# Patient Record
Sex: Female | Born: 1950 | Race: White | Hispanic: No | Marital: Married | Smoking: Never smoker
Health system: Southern US, Community
[De-identification: ages and names within clinical notes are randomized; demographics above are authoritative.]

## PROBLEM LIST (undated history)

## (undated) DIAGNOSIS — D259 Leiomyoma of uterus, unspecified: Secondary | ICD-10-CM

## (undated) DIAGNOSIS — Z860100 Personal history of colon polyps, unspecified: Secondary | ICD-10-CM

## (undated) DIAGNOSIS — Z780 Asymptomatic menopausal state: Secondary | ICD-10-CM

## (undated) DIAGNOSIS — B977 Papillomavirus as the cause of diseases classified elsewhere: Secondary | ICD-10-CM

## (undated) DIAGNOSIS — M810 Age-related osteoporosis without current pathological fracture: Secondary | ICD-10-CM

## (undated) DIAGNOSIS — E7800 Pure hypercholesterolemia, unspecified: Secondary | ICD-10-CM

## (undated) DIAGNOSIS — E559 Vitamin D deficiency, unspecified: Secondary | ICD-10-CM

## (undated) HISTORY — DX: Age-related osteoporosis without current pathological fracture: M81.0

## (undated) HISTORY — DX: Vitamin D deficiency, unspecified: E55.9

## (undated) HISTORY — DX: Leiomyoma of uterus, unspecified: D25.9

## (undated) HISTORY — DX: Asymptomatic menopausal state: Z78.0

## (undated) HISTORY — DX: Papillomavirus as the cause of diseases classified elsewhere: B97.7

## (undated) HISTORY — PX: DILATION AND CURETTAGE OF UTERUS: SHX78

---

## 1968-06-28 HISTORY — PX: OTHER SURGICAL HISTORY: SHX169

## 1998-10-20 ENCOUNTER — Other Ambulatory Visit: Admission: RE | Admit: 1998-10-20 | Discharge: 1998-10-20 | Payer: Self-pay | Admitting: Gynecology

## 2000-01-11 ENCOUNTER — Encounter: Payer: Self-pay | Admitting: Gynecology

## 2000-01-11 ENCOUNTER — Encounter: Admission: RE | Admit: 2000-01-11 | Discharge: 2000-01-11 | Payer: Self-pay | Admitting: Gynecology

## 2000-01-11 ENCOUNTER — Other Ambulatory Visit: Admission: RE | Admit: 2000-01-11 | Discharge: 2000-01-11 | Payer: Self-pay | Admitting: Gynecology

## 2001-01-24 ENCOUNTER — Encounter: Payer: Self-pay | Admitting: Gynecology

## 2001-01-24 ENCOUNTER — Encounter: Admission: RE | Admit: 2001-01-24 | Discharge: 2001-01-24 | Payer: Self-pay | Admitting: Gynecology

## 2001-01-24 ENCOUNTER — Other Ambulatory Visit: Admission: RE | Admit: 2001-01-24 | Discharge: 2001-01-24 | Payer: Self-pay | Admitting: Gynecology

## 2004-05-08 ENCOUNTER — Ambulatory Visit: Payer: Self-pay | Admitting: Obstetrics and Gynecology

## 2004-05-26 ENCOUNTER — Ambulatory Visit: Payer: Self-pay | Admitting: Obstetrics and Gynecology

## 2005-06-01 ENCOUNTER — Ambulatory Visit: Payer: Self-pay | Admitting: Obstetrics and Gynecology

## 2006-01-04 ENCOUNTER — Ambulatory Visit: Payer: Self-pay | Admitting: Endocrinology

## 2006-02-18 ENCOUNTER — Ambulatory Visit: Payer: Self-pay | Admitting: Psychiatry

## 2006-06-17 ENCOUNTER — Ambulatory Visit: Payer: Self-pay | Admitting: Obstetrics and Gynecology

## 2006-07-15 ENCOUNTER — Ambulatory Visit: Payer: Self-pay | Admitting: Gastroenterology

## 2007-07-17 ENCOUNTER — Ambulatory Visit: Payer: Self-pay | Admitting: Obstetrics and Gynecology

## 2008-07-18 ENCOUNTER — Ambulatory Visit: Payer: Self-pay | Admitting: Obstetrics and Gynecology

## 2009-07-18 ENCOUNTER — Ambulatory Visit: Payer: Self-pay | Admitting: Gastroenterology

## 2009-07-21 ENCOUNTER — Ambulatory Visit: Payer: Self-pay | Admitting: Obstetrics and Gynecology

## 2009-07-28 ENCOUNTER — Ambulatory Visit: Payer: Self-pay | Admitting: Gastroenterology

## 2009-12-23 DIAGNOSIS — C4492 Squamous cell carcinoma of skin, unspecified: Secondary | ICD-10-CM

## 2009-12-23 HISTORY — DX: Squamous cell carcinoma of skin, unspecified: C44.92

## 2010-01-09 ENCOUNTER — Ambulatory Visit: Payer: Self-pay | Admitting: Gastroenterology

## 2010-01-13 DIAGNOSIS — Z8589 Personal history of malignant neoplasm of other organs and systems: Secondary | ICD-10-CM

## 2010-01-13 HISTORY — DX: Personal history of malignant neoplasm of other organs and systems: Z85.89

## 2010-08-04 ENCOUNTER — Ambulatory Visit: Payer: Self-pay | Admitting: Obstetrics and Gynecology

## 2011-06-23 ENCOUNTER — Ambulatory Visit: Payer: Self-pay | Admitting: Obstetrics and Gynecology

## 2011-06-29 DIAGNOSIS — Z86018 Personal history of other benign neoplasm: Secondary | ICD-10-CM

## 2011-06-29 HISTORY — DX: Personal history of other benign neoplasm: Z86.018

## 2011-07-09 LAB — HM MAMMOGRAPHY: HM Mammogram: NORMAL

## 2011-08-25 ENCOUNTER — Ambulatory Visit: Payer: Self-pay | Admitting: Obstetrics and Gynecology

## 2012-01-09 LAB — LIPID PANEL
HDL: 107 mg/dL — AB (ref 35–70)
LDL Cholesterol: 115 mg/dL
Triglycerides: 59 mg/dL (ref 40–160)

## 2012-01-09 LAB — TSH: TSH: 3.4 u[IU]/mL (ref 0.41–5.90)

## 2012-01-09 LAB — HEPATIC FUNCTION PANEL
ALT: 22 U/L (ref 7–35)
AST: 27 U/L (ref 13–35)

## 2012-01-09 LAB — BASIC METABOLIC PANEL
Creatinine: 0.5 mg/dL (ref 0.5–1.1)
Glucose: 76 mg/dL

## 2012-06-07 LAB — HM PAP SMEAR: HM Pap smear: NORMAL

## 2012-07-26 ENCOUNTER — Ambulatory Visit: Payer: Self-pay | Admitting: Internal Medicine

## 2012-08-08 ENCOUNTER — Encounter: Payer: Self-pay | Admitting: Internal Medicine

## 2012-08-08 ENCOUNTER — Ambulatory Visit (INDEPENDENT_AMBULATORY_CARE_PROVIDER_SITE_OTHER): Payer: BC Managed Care – PPO | Admitting: Internal Medicine

## 2012-08-08 VITALS — BP 132/64 | HR 64 | Temp 97.8°F | Resp 16 | Ht 61.75 in | Wt 91.5 lb

## 2012-08-08 DIAGNOSIS — E785 Hyperlipidemia, unspecified: Secondary | ICD-10-CM

## 2012-08-08 DIAGNOSIS — M810 Age-related osteoporosis without current pathological fracture: Secondary | ICD-10-CM | POA: Insufficient documentation

## 2012-08-08 DIAGNOSIS — R5381 Other malaise: Secondary | ICD-10-CM

## 2012-08-08 DIAGNOSIS — Z1239 Encounter for other screening for malignant neoplasm of breast: Secondary | ICD-10-CM

## 2012-08-08 DIAGNOSIS — E559 Vitamin D deficiency, unspecified: Secondary | ICD-10-CM

## 2012-08-08 DIAGNOSIS — D259 Leiomyoma of uterus, unspecified: Secondary | ICD-10-CM

## 2012-08-08 NOTE — Progress Notes (Signed)
Patient ID: Kathleen Powers, female   DOB: 1951/02/26, 62 y.o.   MRN: [id number]   Patient Active Problem List  Diagnosis  . Osteoporosis, unspecified  . Uterine fibroid    Subjective:  CC:   Chief Complaint  Patient presents with  . Establish Care    HPI:   Kathleen Powers is a 62 y.o. female who presents as a new patient to establish primary care with the chief complaint of Osteoporosis . She was diagnosed over 4 years ago by PCP.  She is c. urrently on no medications by choice.  Takes calcium and vit  Participates in daily  weight bearing exercise including walking, Pilates and upper body exercuses with low weights.  Has a FH  Of vertebral fractures in mother  Which occurred during treatment for lung CA.  Patient has no history of fractures.  Prior trial of risedronate x 6  Months stopped after development of persistent r hip pain which resolved.  Wondering about Prolia bc it was brought up by former PCP .  Last DEXA 2013 by Moriar[facility] at his office.    2) Mild occasional constipation managed with fruit and water .     Past Medical History  Diagnosis Date  . Uterine fibroid     History reviewed. No pertinent past surgical history.  Family History  Problem Relation Age of Onset  . Cancer Mother     lung  . Heart disease Mother   . COPD Mother   . Heart disease Father     congestive heart failure, valvular cardiomyopathy  . Cancer Maternal Grandmother 13    ovarian  . Aneurysm Maternal Grandfather   . Heart attack Paternal Grandfather   . Heart attack Maternal Uncle     History   Social History  . Marital Status: Married    Spouse Name: N/A    Number of Children: N/A  . Years of Education: N/A   Occupational History  . Not on file.   Social History Main Topics  . Smoking status: Never Smoker   . Smokeless tobacco: Not on file  . Alcohol Use: Yes  . Drug Use: No  . Sexually Active: Not on file   Other Topics Concern  . Not on file   Social History  Narrative  . No narrative on file       @ALLHX @    Review of Systems:   The remainder of the review of systems was negative except those addressed in the HPI.       Objective:  BP 132/64  Pulse 64  Temp(Src) 97.8 F (36.6 C) (Oral)  Resp 16  Ht 5' 1.75" (1.568 m)  Wt 91 lb 8 oz (41.504 kg)  BMI 16.88 kg/m2  SpO2 98%  General appearance: alert, cooperative and appears stated age Neck: no adenopathy, no carotid bruit, supple, symmetrical, trachea midline and thyroid not enlarged, symmetric, no tenderness/mass/nodules Back: symmetric, no curvature. ROM normal. No CVA tenderness. Lungs: clear to auscultation bilaterally Heart: regular rate and rhythm, S1, S2 normal, no murmur, click, rub or gallop Abdomen: soft, non-tender; bowel sounds normal; no masses,  no organomegaly Pulses: 2+ and symmetric Skin: Skin color, texture, turgor normal. No rashes or lesions Lymph nodes: Cervical, supraclavicular, and axillary nodes normal. Neuro: grossly nonfocal  Assessment and Plan:  Osteoporosis, unspecified Untreated secondary to risedronate intolerance.  recoreds requested ,  Risk and benefits of Prolia and Evista discussed at length.  Will dell defer treatment until DEXAs can be reviewed.  Checking vit d level.   Uterine fibroid 4 x 3 x 3 cm by 2012 ultrasound.   Screening for breast cancer She is due for mammogram at [facility]    Updated Medication List Outpatient Encounter Prescriptions as of 08/08/2012  Medication Sig Dispense Refill  . Calcium Carbonate (CALCIUM 600 PO) Take 600 mg by mouth daily.      . Cholecalciferol (VITAMIN D) 1000 UNITS capsule Take 1,000 Units by mouth daily.      . milk thistle 175 MG tablet Take 175 mg by mouth daily.      . Multiple Vitamin (MULTIVITAMIN) tablet Take 1 tablet by mouth daily.      . Probiotic Product (PROBIOTIC DAILY PO) Take 1 capsule by mouth daily.      . TURMERIC PO Take 1 capsule by mouth daily.       No  facility-administered encounter medications on file as of 08/08/2012.

## 2012-08-09 ENCOUNTER — Encounter: Payer: Self-pay | Admitting: Internal Medicine

## 2012-08-09 DIAGNOSIS — Z1239 Encounter for other screening for malignant neoplasm of breast: Secondary | ICD-10-CM | POA: Insufficient documentation

## 2012-08-09 DIAGNOSIS — D259 Leiomyoma of uterus, unspecified: Secondary | ICD-10-CM | POA: Insufficient documentation

## 2012-08-09 NOTE — Assessment & Plan Note (Signed)
Untreated secondary to risedronate intolerance.  recoreds requested ,  Risk and benefits of Prolia and Evista discussed at length.  Will dell defer treatment until DEXAs can be reviewed.  Checking vit d level.

## 2012-08-09 NOTE — Assessment & Plan Note (Signed)
She is due for mammogram at [facility].

## 2012-08-09 NOTE — Assessment & Plan Note (Signed)
4 x 3 x 3 cm by 2012 ultrasound.

## 2012-08-29 ENCOUNTER — Ambulatory Visit: Payer: Self-pay | Admitting: Internal Medicine

## 2012-09-13 ENCOUNTER — Ambulatory Visit: Payer: Self-pay | Admitting: Internal Medicine

## 2012-10-03 ENCOUNTER — Encounter: Payer: Self-pay | Admitting: Internal Medicine

## 2013-02-06 ENCOUNTER — Telehealth: Payer: Self-pay | Admitting: Internal Medicine

## 2013-02-06 NOTE — Telephone Encounter (Signed)
Pt dropped of medical records for dr Darrick Huntsman to look and make copies for what dr Darrick Huntsman wants   And  Patient wants her records back  Please call pt when her records are ready for pick up In box

## 2013-02-07 NOTE — Telephone Encounter (Signed)
Patient talking about the large envelope I gave you at the end of the day mark what you would like copies of and give back and I will take care of it.

## 2013-02-08 ENCOUNTER — Telehealth: Payer: Self-pay | Admitting: Internal Medicine

## 2013-02-08 ENCOUNTER — Encounter: Payer: Self-pay | Admitting: Internal Medicine

## 2013-02-08 DIAGNOSIS — M810 Age-related osteoporosis without current pathological fracture: Secondary | ICD-10-CM

## 2013-02-08 NOTE — Assessment & Plan Note (Signed)
Last DEXA 2013 July Moryati  t Scores -2.97 spine  reclast or prolia advised

## 2013-02-08 NOTE — Telephone Encounter (Signed)
I have culled through her records and copied what I needed.  Thank her for supplying the,  She will want to pick them up

## 2013-02-12 NOTE — Telephone Encounter (Signed)
Notified patient records ready for pickup on 02/09/13

## 2013-03-01 ENCOUNTER — Ambulatory Visit (INDEPENDENT_AMBULATORY_CARE_PROVIDER_SITE_OTHER): Payer: BC Managed Care – PPO | Admitting: Internal Medicine

## 2013-03-01 ENCOUNTER — Other Ambulatory Visit (HOSPITAL_COMMUNITY)
Admission: RE | Admit: 2013-03-01 | Discharge: 2013-03-01 | Disposition: A | Discharge status: Home / Self Care | Payer: BC Managed Care – PPO | Source: Ambulatory Visit | Attending: Internal Medicine | Admitting: Internal Medicine

## 2013-03-01 ENCOUNTER — Telehealth: Payer: Self-pay | Admitting: *Deleted

## 2013-03-01 ENCOUNTER — Encounter: Payer: Self-pay | Admitting: Internal Medicine

## 2013-03-01 VITALS — BP 130/62 | HR 57 | Temp 98.1°F | Resp 12 | Ht 62.0 in | Wt 91.2 lb

## 2013-03-01 DIAGNOSIS — M810 Age-related osteoporosis without current pathological fracture: Secondary | ICD-10-CM

## 2013-03-01 DIAGNOSIS — R5381 Other malaise: Secondary | ICD-10-CM

## 2013-03-01 DIAGNOSIS — Z1151 Encounter for screening for human papillomavirus (HPV): Secondary | ICD-10-CM | POA: Insufficient documentation

## 2013-03-01 DIAGNOSIS — Z0000 Encounter for general adult medical examination without abnormal findings: Secondary | ICD-10-CM

## 2013-03-01 DIAGNOSIS — Z8742 Personal history of other diseases of the female genital tract: Secondary | ICD-10-CM

## 2013-03-01 DIAGNOSIS — E559 Vitamin D deficiency, unspecified: Secondary | ICD-10-CM

## 2013-03-01 DIAGNOSIS — E785 Hyperlipidemia, unspecified: Secondary | ICD-10-CM

## 2013-03-01 DIAGNOSIS — Z01419 Encounter for gynecological examination (general) (routine) without abnormal findings: Secondary | ICD-10-CM | POA: Insufficient documentation

## 2013-03-01 DIAGNOSIS — Z87898 Personal history of other specified conditions: Secondary | ICD-10-CM

## 2013-03-01 DIAGNOSIS — R87810 Cervical high risk human papillomavirus (HPV) DNA test positive: Secondary | ICD-10-CM | POA: Insufficient documentation

## 2013-03-01 DIAGNOSIS — N952 Postmenopausal atrophic vaginitis: Secondary | ICD-10-CM

## 2013-03-01 DIAGNOSIS — E673 Hypervitaminosis D: Secondary | ICD-10-CM

## 2013-03-01 LAB — CBC WITH DIFFERENTIAL/PLATELET
Basophils Absolute: 0 10*3/uL (ref 0.0–0.1)
Eosinophils Absolute: 0.2 10*3/uL (ref 0.0–0.7)
Hemoglobin: 12.5 g/dL (ref 12.0–15.0)
Lymphocytes Relative: 34.5 % (ref 12.0–46.0)
MCHC: 33.7 g/dL (ref 30.0–36.0)
Neutro Abs: 3.1 10*3/uL (ref 1.4–7.7)
Neutrophils Relative %: 54.4 % (ref 43.0–77.0)
Platelets: 230 10*3/uL (ref 150.0–400.0)
RDW: 13.4 % (ref 11.5–14.6)

## 2013-03-01 LAB — COMPREHENSIVE METABOLIC PANEL
ALT: 22 U/L (ref 0–35)
AST: 32 U/L (ref 0–37)
Albumin: 4.4 g/dL (ref 3.5–5.2)
CO2: 29 mEq/L (ref 19–32)
Calcium: 9.1 mg/dL (ref 8.4–10.5)
Chloride: 100 mEq/L (ref 96–112)
Potassium: 3.8 mEq/L (ref 3.5–5.1)
Total Protein: 6.6 g/dL (ref 6.0–8.3)

## 2013-03-01 LAB — LDL CHOLESTEROL, DIRECT: Direct LDL: 107.8 mg/dL

## 2013-03-01 LAB — LIPID PANEL: Total CHOL/HDL Ratio: 2

## 2013-03-01 MED ORDER — ESTRADIOL 10 MCG VA TABS: 1.0000 | ORAL_TABLET | VAGINAL | Status: DC

## 2013-03-01 NOTE — Patient Instructions (Addendum)
Mammogram to be set up i nMarch 2015  PAP smear done today; we'll contact you with the results ; Your labs as well

## 2013-03-01 NOTE — Telephone Encounter (Signed)
Pt would like results mailed to her and to call her with the results as well

## 2013-03-01 NOTE — Progress Notes (Addendum)
Patient ID: Kathleen Powers, female   DOB: 07-09-1950, 62 y.o.   MRN: [id number]  Subjective:     Kathleen Powers is a 62 y.o. female and is here for a comprehensive physical exam. The patient reports no problems.  History   Social History  . Marital Status: Married    Spouse Name: N/A    Number of Children: N/A  . Years of Education: N/A   Occupational History  . Not on file.   Social History Main Topics  . Smoking status: Never Smoker   . Smokeless tobacco: Not on file  . Alcohol Use: Yes  . Drug Use: No  . Sexual Activity: Not on file   Other Topics Concern  . Not on file   Social History Narrative  . No narrative on file   Health Maintenance  Topic Date Due  . Zostavax  01/08/2009  . Influenza Vaccine  01/26/2013  . Mammogram  08/30/2014  . Pap Smear  06/08/2015  . Tetanus/tdap  08/09/2019  . Colonoscopy  03/01/2020    The following portions of the patient's history were reviewed and updated as appropriate: allergies, current medications, past family history, past medical history, past social history, past surgical history and problem list.  Review of Systems A comprehensive review of systems was negative.   Objective:   General Appearance:    Alert, cooperative, no distress, appears stated age  Head:    Normocephalic, without obvious abnormality, atraumatic  Eyes:    PERRL, conjunctiva/corneas clear, EOM's intact, fundi    benign, both eyes  Ears:    Normal TM's and external ear canals, both ears  Nose:   Nares normal, septum midline, mucosa normal, no drainage    or sinus tenderness  Throat:   Lips, mucosa, and tongue normal; teeth and gums normal  Neck:   Supple, symmetrical, trachea midline, no adenopathy;    thyroid:  no enlargement/tenderness/nodules; no carotid   bruit or JVD  Back:     Symmetric, no curvature, ROM normal, no CVA tenderness  Lungs:     Clear to auscultation bilaterally, respirations unlabored  Chest Wall:    No tenderness or  deformity   Heart:    Regular rate and rhythm, S1 and S2 normal, no murmur, rub   or gallop  Breast Exam:    No tenderness, masses, or nipple abnormality  Abdomen:     Soft, non-tender, bowel sounds active all four quadrants,    no masses, no organomegaly  Genitalia:    Pelvic: cervix scarred ,  external genitalia normal, no adnexal masses or tenderness, no cervical motion tenderness, rectovaginal septum normal, uterus normal size, shape, and consistency and vagina normal without discharge  Extremities:   Extremities normal, atraumatic, no cyanosis or edema  Pulses:   2+ and symmetric all extremities  Skin:   Skin color, texture, turgor normal, no rashes or lesions  Lymph nodes:   Cervical, supraclavicular, and axillary nodes normal  Neurologic:   CNII-XII intact, normal strength, sensation and reflexes    throughout   Assessment:   Encounter for preventive health examination Annual comprehensive exam was done including breast, pelvic and PAP smear. All screenings have been addressed .   Osteoporosis, unspecified She is not due for DEXA until next year.  Bone Density scores received, Continue calcium, vitamin d and weight bearing exercise on a regular basis.   Hypervitaminosis d  Patient was asked to stop all vitamin D supplementation other than what is included her calcium tablet  and return in 3 months for a repeat vitamin D level.   Updated Medication List Outpatient Encounter Prescriptions as of 03/01/2013  Medication Sig Dispense Refill  . Cholecalciferol (VITAMIN D) 1000 UNITS capsule Take 1,000 Units by mouth daily.      . milk thistle 175 MG tablet Take 175 mg by mouth daily.      . Multiple Vitamin (MULTIVITAMIN) tablet Take 1 tablet by mouth daily.      . Probiotic Product (PROBIOTIC DAILY PO) Take 1 capsule by mouth daily.      . TURMERIC PO Take 1 capsule by mouth daily.      . Calcium Carbonate (CALCIUM 600 PO) Take 600 mg by mouth daily.      . Estradiol 10 MCG TABS  vaginal tablet Place 1 tablet (10 mcg total) vaginally 2 (two) times a week.  8 tablet  11   No facility-administered encounter medications on file as of 03/01/2013.

## 2013-03-02 LAB — VITAMIN D 25 HYDROXY (VIT D DEFICIENCY, FRACTURES): Vit D, 25-Hydroxy: 92 ng/mL — ABNORMAL HIGH (ref 30–89)

## 2013-03-03 DIAGNOSIS — Z0000 Encounter for general adult medical examination without abnormal findings: Secondary | ICD-10-CM | POA: Insufficient documentation

## 2013-03-03 NOTE — Assessment & Plan Note (Signed)
Annual comprehensive exam was done including breast, pelvic and PAP smear. All screenings have been addressed .  

## 2013-03-03 NOTE — Assessment & Plan Note (Signed)
She is not due for DEXA until next year.  Bone Density scores received, Continue calcium, vitamin d and weight bearing exercise on a regular basis.

## 2013-03-04 ENCOUNTER — Encounter: Payer: Self-pay | Admitting: Internal Medicine

## 2013-03-04 DIAGNOSIS — E673 Hypervitaminosis D: Secondary | ICD-10-CM | POA: Insufficient documentation

## 2013-03-04 NOTE — Assessment & Plan Note (Signed)
Patient was asked to stop all vitamin D supplementation other than what is included her calcium tablet and return in 3 months for a repeat vitamin D level.

## 2013-03-04 NOTE — Addendum Note (Signed)
Addended by: Sherlene Shams on: 03/04/2013 08:40 AM   Modules accepted: Orders

## 2013-03-08 ENCOUNTER — Encounter: Payer: Self-pay | Admitting: *Deleted

## 2013-03-20 NOTE — Telephone Encounter (Signed)
Results mailed 

## 2013-05-03 ENCOUNTER — Other Ambulatory Visit: Payer: Self-pay

## 2013-08-15 ENCOUNTER — Telehealth: Payer: Self-pay | Admitting: Internal Medicine

## 2013-08-15 DIAGNOSIS — N952 Postmenopausal atrophic vaginitis: Secondary | ICD-10-CM

## 2013-08-15 MED ORDER — ESTRADIOL 10 MCG VA TABS: 1.0000 | ORAL_TABLET | VAGINAL | Status: DC

## 2013-08-15 NOTE — Telephone Encounter (Signed)
Received refill request for Estriol 1 mg. Vaginal tabs. Insert 1 tablet vaginally twice weekly. This was not her medication list, please advise.

## 2013-08-15 NOTE — Telephone Encounter (Signed)
Ok to refill,  Authorized in epic 

## 2013-08-16 ENCOUNTER — Other Ambulatory Visit: Payer: Self-pay | Admitting: *Deleted

## 2013-08-16 DIAGNOSIS — N952 Postmenopausal atrophic vaginitis: Secondary | ICD-10-CM

## 2013-08-16 NOTE — Telephone Encounter (Signed)
Script faxed to pharmacy

## 2013-08-17 ENCOUNTER — Other Ambulatory Visit: Payer: Self-pay | Admitting: *Deleted

## 2013-08-17 DIAGNOSIS — N952 Postmenopausal atrophic vaginitis: Secondary | ICD-10-CM

## 2013-08-24 ENCOUNTER — Telehealth: Payer: Self-pay | Admitting: *Deleted

## 2013-08-24 NOTE — Telephone Encounter (Signed)
Verbal given 

## 2013-08-24 NOTE — Telephone Encounter (Signed)
Patient is requesting a compound made by [facility] and not the Vagifem is it ok to fill? Please advise.

## 2013-08-24 NOTE — Telephone Encounter (Signed)
Yes, have [facility] send me the rx and I will sign

## 2013-09-04 ENCOUNTER — Other Ambulatory Visit: Payer: Self-pay | Admitting: Internal Medicine

## 2013-09-04 ENCOUNTER — Encounter: Payer: Self-pay | Admitting: Internal Medicine

## 2013-09-04 ENCOUNTER — Ambulatory Visit (INDEPENDENT_AMBULATORY_CARE_PROVIDER_SITE_OTHER): Payer: BC Managed Care – PPO | Admitting: Internal Medicine

## 2013-09-04 VITALS — BP 140/62 | HR 61 | Temp 98.2°F | Resp 16 | Wt 94.8 lb

## 2013-09-04 DIAGNOSIS — E673 Hypervitaminosis D: Secondary | ICD-10-CM

## 2013-09-04 DIAGNOSIS — IMO0002 Reserved for concepts with insufficient information to code with codable children: Secondary | ICD-10-CM

## 2013-09-04 DIAGNOSIS — E559 Vitamin D deficiency, unspecified: Secondary | ICD-10-CM

## 2013-09-04 DIAGNOSIS — Z124 Encounter for screening for malignant neoplasm of cervix: Secondary | ICD-10-CM

## 2013-09-04 NOTE — Patient Instructions (Signed)
We repeated your PAP smear today  We will refer  You to Dr Enzo Bi if Kathleen Powers is still HPV positive  Vitamin D level to be rechecked today

## 2013-09-05 ENCOUNTER — Ambulatory Visit: Payer: BC Managed Care – PPO | Admitting: Internal Medicine

## 2013-09-05 ENCOUNTER — Encounter: Payer: Self-pay | Admitting: Internal Medicine

## 2013-09-05 DIAGNOSIS — R87810 Cervical high risk human papillomavirus (HPV) DNA test positive: Secondary | ICD-10-CM | POA: Insufficient documentation

## 2013-09-05 LAB — VITAMIN D 25 HYDROXY (VIT D DEFICIENCY, FRACTURES): Vit D, 25-Hydroxy: 70 ng/mL (ref 30–89)

## 2013-09-05 NOTE — Progress Notes (Signed)
Patient ID: Kathleen Powers, female   DOB: 09/17/1950, 63 y.o.   MRN: [id number]   Patient Active Problem List   Diagnosis Date Noted  . Pap smear for cervical cancer screening 09/05/2013  . Hypervitaminosis d 03/04/2013  . Encounter for preventive health examination 03/03/2013  . Screening for breast cancer 08/09/2012  . Uterine fibroid   . Osteoporosis, unspecified 08/08/2012    Subjective:  CC:   Chief Complaint  Patient presents with  . Follow-up    with repeat PAP    HPI:   Kathleen Powers is a 63 y.o. female who presents for repeat PAP smear as her last one was HPV positive 6 months ago.  She has not been sexually active and has no history of  Abnormal PAP smears.    Past Medical History  Diagnosis Date  . Uterine fibroid     No past surgical history on file.     The following portions of the patient's history were reviewed and updated as appropriate: Allergies, current medications, and problem list.    Review of Systems:   Patient denies headache, fevers, malaise, unintentional weight loss, skin rash, eye pain, sinus congestion and sinus pain, sore throat, dysphagia,  hemoptysis , cough, dyspnea, wheezing, chest pain, palpitations, orthopnea, edema, abdominal pain, nausea, melena, diarrhea, constipation, flank pain, dysuria, hematuria, urinary  Frequency, nocturia, numbness, tingling, seizures,  Focal weakness, Loss of consciousness,  Tremor, insomnia, depression, anxiety, and suicidal ideation.     History   Social History  . Marital Status: Married    Spouse Name: N/A    Number of Children: N/A  . Years of Education: N/A   Occupational History  . Not on file.   Social History Main Topics  . Smoking status: Never Smoker   . Smokeless tobacco: Not on file  . Alcohol Use: Yes  . Drug Use: No  . Sexual Activity: Not on file   Other Topics Concern  . Not on file   Social History Narrative  . No narrative on file    Objective:  Filed  Vitals:   09/04/13 1551  BP: 140/62  Pulse: 61  Temp: 98.2 F (36.8 C)  Resp: 16   General Appearance:    Alert, cooperative, no distress, appears stated age  Head:    Normocephalic, without obvious abnormality, atraumatic  Eyes:    PERRL, conjunctiva/corneas clear, EOM's intact, fundi    benign, both eyes  Ears:    Normal TM's and external ear canals, both ears  Nose:   Nares normal, septum midline, mucosa normal, no drainage    or sinus tenderness  Throat:   Lips, mucosa, and tongue normal; teeth and gums normal  Neck:   Supple, symmetrical, trachea midline, no adenopathy;    thyroid:  no enlargement/tenderness/nodules; no carotid   bruit or JVD  Back:     Symmetric, no curvature, ROM normal, no CVA tenderness  Lungs:     Clear to auscultation bilaterally, respirations unlabored  Chest Wall:    No tenderness or deformity   Heart:    Regular rate and rhythm, S1 and S2 normal, no murmur, rub   or gallop  Breast Exam:    No tenderness, masses, or nipple abnormality  Abdomen:     Soft, non-tender, bowel sounds active all four quadrants,    no masses, no organomegaly  Genitalia:    Pelvic: cervix normal in appearance, external genitalia normal, no adnexal masses or tenderness, no cervical motion tenderness, rectovaginal septum normal,  uterus normal size, shape, and consistency and vagina normal without discharge  Extremities:   Extremities normal, atraumatic, no cyanosis or edema  Pulses:   2+ and symmetric all extremities  Skin:   Skin color, texture, turgor normal, no rashes or lesions  Lymph nodes:   Cervical, supraclavicular, and axillary nodes normal  Neurologic:   CNII-XII intact, normal strength, sensation and reflexes    throughout     Assessment and Plan:  Pap smear for cervical cancer screening PAP smear repeated today .  If HPV remains positive, will refer to Dr Enzo Bi for evaluation.  Hypervitaminosis d Repeat level afgter stopping supplements is normal at  70   Updated Medication List Outpatient Encounter Prescriptions as of 09/04/2013  Medication Sig  . COD LIVER OIL PO Take 2.5 mLs by mouth daily.  . milk thistle 175 MG tablet Take 175 mg by mouth daily.  . Multiple Vitamin (MULTIVITAMIN) tablet Take 1 tablet by mouth daily.  . Probiotic Product (PROBIOTIC DAILY PO) Take 1 capsule by mouth daily.  . Calcium Carbonate (CALCIUM 600 PO) Take 600 mg by mouth daily.  . Cholecalciferol (VITAMIN D) 1000 UNITS capsule Take 1,000 Units by mouth daily.  . Estradiol 10 MCG TABS vaginal tablet Place 1 tablet (10 mcg total) vaginally 2 (two) times a week.  . TURMERIC PO Take 1 capsule by mouth daily.     Orders Placed This Encounter  Procedures  . Vit D  25 hydroxy (rtn osteoporosis monitoring)    No Follow-up on file.

## 2013-09-05 NOTE — Assessment & Plan Note (Signed)
PAP smear repeated today .  If HPV remains positive, will refer to Dr Enzo Bi for evaluation.

## 2013-09-05 NOTE — Assessment & Plan Note (Signed)
Repeat level afgter stopping supplements is normal at 70

## 2013-09-06 ENCOUNTER — Ambulatory Visit: Payer: Self-pay | Admitting: Internal Medicine

## 2013-09-10 LAB — PAP IG AND HPV HIGH-RISK: HPV DNA High Risk: DETECTED — AB

## 2013-09-11 ENCOUNTER — Encounter: Payer: Self-pay | Admitting: Emergency Medicine

## 2013-09-11 ENCOUNTER — Encounter: Payer: Self-pay | Admitting: Internal Medicine

## 2013-09-11 NOTE — Addendum Note (Signed)
Addended by: Crecencio Mc on: 09/11/2013 07:27 AM   Modules accepted: Orders

## 2013-09-14 ENCOUNTER — Telehealth: Payer: Self-pay | Admitting: Internal Medicine

## 2013-09-14 DIAGNOSIS — Z0279 Encounter for issue of other medical certificate: Secondary | ICD-10-CM

## 2013-09-14 NOTE — Telephone Encounter (Signed)
In red folder. 

## 2013-09-14 NOTE — Telephone Encounter (Signed)
Pt dropped off health examination certificate In box Please call when ready for pick up

## 2013-09-21 NOTE — Telephone Encounter (Signed)
Notified patient left detailed message on voicemail paperwork placed up front for pickup.

## 2013-10-02 ENCOUNTER — Encounter: Payer: Self-pay | Admitting: Internal Medicine

## 2013-10-30 ENCOUNTER — Other Ambulatory Visit: Payer: Self-pay | Admitting: *Deleted

## 2013-10-30 DIAGNOSIS — N952 Postmenopausal atrophic vaginitis: Secondary | ICD-10-CM

## 2013-11-05 ENCOUNTER — Other Ambulatory Visit: Payer: Self-pay | Admitting: *Deleted

## 2013-11-05 DIAGNOSIS — N952 Postmenopausal atrophic vaginitis: Secondary | ICD-10-CM

## 2013-11-05 MED ORDER — ESTRADIOL 10 MCG VA TABS: 1.0000 | ORAL_TABLET | VAGINAL | Status: DC

## 2013-11-06 NOTE — Telephone Encounter (Signed)
Called [facility] and found that patient estrogen is a compound called Estriol E3 that is made for patient called and filled medicationas requested, and ask in future to fax request as we cannot put a compound in [facility].

## 2014-03-12 ENCOUNTER — Encounter: Payer: Self-pay | Admitting: Internal Medicine

## 2014-03-12 ENCOUNTER — Telehealth: Payer: Self-pay | Admitting: Internal Medicine

## 2014-03-12 ENCOUNTER — Ambulatory Visit (INDEPENDENT_AMBULATORY_CARE_PROVIDER_SITE_OTHER): Payer: PRIVATE HEALTH INSURANCE | Admitting: Internal Medicine

## 2014-03-12 ENCOUNTER — Encounter: Payer: BC Managed Care – PPO | Admitting: Internal Medicine

## 2014-03-12 VITALS — BP 138/78 | HR 69 | Temp 97.7°F | Resp 14 | Ht 61.5 in | Wt 88.0 lb

## 2014-03-12 DIAGNOSIS — M810 Age-related osteoporosis without current pathological fracture: Secondary | ICD-10-CM

## 2014-03-12 DIAGNOSIS — E785 Hyperlipidemia, unspecified: Secondary | ICD-10-CM

## 2014-03-12 DIAGNOSIS — E673 Hypervitaminosis D: Secondary | ICD-10-CM

## 2014-03-12 DIAGNOSIS — Z0000 Encounter for general adult medical examination without abnormal findings: Secondary | ICD-10-CM

## 2014-03-12 DIAGNOSIS — Z1239 Encounter for other screening for malignant neoplasm of breast: Secondary | ICD-10-CM

## 2014-03-12 DIAGNOSIS — R634 Abnormal weight loss: Secondary | ICD-10-CM

## 2014-03-12 DIAGNOSIS — Z1159 Encounter for screening for other viral diseases: Secondary | ICD-10-CM

## 2014-03-12 DIAGNOSIS — R87810 Cervical high risk human papillomavirus (HPV) DNA test positive: Secondary | ICD-10-CM

## 2014-03-12 NOTE — Telephone Encounter (Signed)
Dr. Buckner Malta PAP smear was HPV positive but he did not get the cervix sampled as he implied at your visit.  Follow up with him as previously advised

## 2014-03-12 NOTE — Progress Notes (Signed)
Pre-visit discussion using our clinic review tool. No additional management support is needed unless otherwise documented below in the visit note.  

## 2014-03-12 NOTE — Patient Instructions (Addendum)
You had your annual  wellness exam today.  We will get the records from Dr Enzo Bi  We will schedule your mammogram at  In March at [place] .   You declined the influenza,  penumonia and Shingles vaccines today.    Please make an appt for fasting labs at your earliest convenience.   We will contact you with the bloodwork results   I recommend getting the majority of your calcium and Vitamin D  through diet rather than supplements given the recent association of calcium supplements with increased coronary artery calcium scores  Unsweetened almond/coconut milk is a great low calorie low carb way to increase your dietary calcium and vitamin D.  Try the blue [facility] Maintenance Adopting a healthy lifestyle and getting preventive care can go a long way to promote health and wellness. Talk with your health care provider about what schedule of regular examinations is right for you. This is a good chance for you to check in with your provider about disease prevention and staying healthy. In between checkups, there are plenty of things you can do on your own. Experts have done a lot of research about which lifestyle changes and preventive measures are most likely to keep you healthy. Ask your health care provider for more information. WEIGHT AND DIET  Eat a healthy diet  Be sure to include plenty of vegetables, fruits, low-fat dairy products, and lean protein.  Do not eat a lot of foods high in solid fats, added sugars, or salt.  Get regular exercise. This is one of the most important things you can do for your health.  Most adults should exercise for at least 150 minutes each week. The exercise should increase your heart rate and make you sweat (moderate-intensity exercise).  Most adults should also do strengthening exercises at least twice a week. This is in addition to the moderate-intensity exercise.  Maintain a healthy weight  Body mass index (BMI) is a measurement  that can be used to identify possible weight problems. It estimates body fat based on height and weight. Your health care provider can help determine your BMI and help you achieve or maintain a healthy weight.  For females 72 years of age and older:   A BMI below 18.5 is considered underweight.  A BMI of 18.5 to 24.9 is normal.  A BMI of 25 to 29.9 is considered overweight.  A BMI of 30 and above is considered obese.  Watch levels of cholesterol and blood lipids  You should start having your blood tested for lipids and cholesterol at 65 years of age, then have this test every 5 years.  You may need to have your cholesterol levels checked more often if:  Your lipid or cholesterol levels are high.  You are older than 65 years of age.  You are at high risk for heart disease.  CANCER SCREENING   Lung Cancer  Lung cancer screening is recommended for adults 32-89 years old who are at high risk for lung cancer because of a history of smoking.  A yearly low-dose CT scan of the lungs is recommended for people who:  Currently smoke.  Have quit within the past 15 years.  Have at least a 30-pack-year history of smoking. A pack year is smoking an average of one pack of cigarettes a day for 1 year.  Yearly screening should continue until it has been 15 years since you quit.  Yearly screening should stop if you develop  a health problem that would prevent you from having lung cancer treatment.  Breast Cancer  Practice breast self-awareness. This means understanding how your breasts normally appear and feel.  It also means doing regular breast self-exams. Let your health care provider know about any changes, no matter how small.  If you are in your 20s or 30s, you should have a clinical breast exam (CBE) by a health care provider every 1-3 years as part of a regular health exam.  If you are 32 or older, have a CBE every year. Also consider having a breast X-ray (mammogram) every  year.  If you have a family history of breast cancer, talk to your health care provider about genetic screening.  If you are at high risk for breast cancer, talk to your health care provider about having an MRI and a mammogram every year.  Breast cancer gene (BRCA) assessment is recommended for women who have family members with BRCA-related cancers. BRCA-related cancers include:  Breast.  Ovarian.  Tubal.  Peritoneal cancers.  Results of the assessment will determine the need for genetic counseling and BRCA1 and BRCA2 testing. Cervical Cancer Routine pelvic examinations to screen for cervical cancer are no longer recommended for nonpregnant women who are considered low risk for cancer of the pelvic organs (ovaries, uterus, and vagina) and who do not have symptoms. A pelvic examination may be necessary if you have symptoms including those associated with pelvic infections. Ask your health care provider if a screening pelvic exam is right for you.   The Pap test is the screening test for cervical cancer for women who are considered at risk.  If you had a hysterectomy for a problem that was not cancer or a condition that could lead to cancer, then you no longer need Pap tests.  If you are older than 65 years, and you have had normal Pap tests for the past 10 years, you no longer need to have Pap tests.  If you have had past treatment for cervical cancer or a condition that could lead to cancer, you need Pap tests and screening for cancer for at least 20 years after your treatment.  If you no longer get a Pap test, assess your risk factors if they change (such as having a new sexual partner). This can affect whether you should start being screened again.  Some women have medical problems that increase their chance of getting cervical cancer. If this is the case for you, your health care provider may recommend more frequent screening and Pap tests.  The human papillomavirus (HPV) test is  another test that may be used for cervical cancer screening. The HPV test looks for the virus that can cause cell changes in the cervix. The cells collected during the Pap test can be tested for HPV.  The HPV test can be used to screen women 58 years of age and older. Getting tested for HPV can extend the interval between normal Pap tests from three to five years.  An HPV test also should be used to screen women of any age who have unclear Pap test results.  After 65 years of age, women should have HPV testing as often as Pap tests.  Colorectal Cancer  This type of cancer can be detected and often prevented.  Routine colorectal cancer screening usually begins at 64 years of age and continues through 65 years of age.  Your health care provider may recommend screening at an earlier age if you have risk  factors for colon cancer.  Your health care provider may also recommend using home test kits to check for hidden blood in the stool.  A small camera at the end of a tube can be used to examine your colon directly (sigmoidoscopy or colonoscopy). This is done to check for the earliest forms of colorectal cancer.  Routine screening usually begins at age 83.  Direct examination of the colon should be repeated every 5-10 years through 65 years of age. However, you may need to be screened more often if early forms of precancerous polyps or small growths are found. Skin Cancer  Check your skin from head to toe regularly.  Tell your health care provider about any new moles or changes in moles, especially if there is a change in a mole's shape or color.  Also tell your health care provider if you have a mole that is larger than the size of a pencil eraser.  Always use sunscreen. Apply sunscreen liberally and repeatedly throughout the day.  Protect yourself by wearing long sleeves, pants, a wide-brimmed hat, and sunglasses whenever you are outside. HEART DISEASE, DIABETES, AND HIGH BLOOD PRESSURE    Have your blood pressure checked at least every 1-2 years. High blood pressure causes heart disease and increases the risk of stroke.  If you are between 31 years and 31 years old, ask your health care provider if you should take aspirin to prevent strokes.  Have regular diabetes screenings. This involves taking a blood sample to check your fasting blood sugar level.  If you are at a normal weight and have a low risk for diabetes, have this test once every three years after 65 years of age.  If you are overweight and have a high risk for diabetes, consider being tested at a younger age or more often. PREVENTING INFECTION  Hepatitis B  If you have a higher risk for hepatitis B, you should be screened for this virus. You are considered at high risk for hepatitis B if:  You were born in a country where hepatitis B is common. Ask your health care provider which countries are considered high risk.  Your parents were born in a high-risk country, and you have not been immunized against hepatitis B (hepatitis B vaccine).  You have HIV or AIDS.  You use needles to inject street drugs.  You live with someone who has hepatitis B.  You have had sex with someone who has hepatitis B.  You get hemodialysis treatment.  You take certain medicines for conditions, including cancer, organ transplantation, and autoimmune conditions. Hepatitis C  Blood testing is recommended for:  Everyone born from [birth year] through 1967.  Anyone with known risk factors for hepatitis C. Sexually transmitted infections (STIs)  You should be screened for sexually transmitted infections (STIs) including gonorrhea and chlamydia if:  You are sexually active and are younger than 65 years of age.  You are older than 65 years of age and your health care provider tells you that you are at risk for this type of infection.  Your sexual activity has changed since you were last screened and you are at an increased risk for  chlamydia or gonorrhea. Ask your health care provider if you are at risk.  If you do not have HIV, but are at risk, it may be recommended that you take a prescription medicine daily to prevent HIV infection. This is called pre-exposure prophylaxis (PrEP). You are considered at risk if:  You are sexually active  and do not regularly use condoms or know the HIV status of your partner(s).  You take drugs by injection.  You are sexually active with a partner who has HIV. Talk with your health care provider about whether you are at high risk of being infected with HIV. If you choose to begin PrEP, you should first be tested for HIV. You should then be tested every 3 months for as long as you are taking PrEP.  PREGNANCY   If you are premenopausal and you may become pregnant, ask your health care provider about preconception counseling.  If you may become pregnant, take 400 to 800 micrograms (mcg) of folic acid every day.  If you want to prevent pregnancy, talk to your health care provider about birth control (contraception). OSTEOPOROSIS AND MENOPAUSE   Osteoporosis is a disease in which the bones lose minerals and strength with aging. This can result in serious bone fractures. Your risk for osteoporosis can be identified using a bone density scan.  If you are 50 years of age or older, or if you are at risk for osteoporosis and fractures, ask your health care provider if you should be screened.  Ask your health care provider whether you should take a calcium or vitamin D supplement to lower your risk for osteoporosis.  Menopause may have certain physical symptoms and risks.  Hormone replacement therapy may reduce some of these symptoms and risks. Talk to your health care provider about whether hormone replacement therapy is right for you.  HOME CARE INSTRUCTIONS   Schedule regular health, dental, and eye exams.  Stay current with your immunizations.   Do not use any tobacco products  including cigarettes, chewing tobacco, or electronic cigarettes.  If you are pregnant, do not drink alcohol.  If you are breastfeeding, limit how much and how often you drink alcohol.  Limit alcohol intake to no more than 1 drink per day for nonpregnant women. One drink equals 12 ounces of beer, 5 ounces of wine, or 1 ounces of hard liquor.  Do not use street drugs.  Do not share needles.  Ask your health care provider for help if you need support or information about quitting drugs.  Tell your health care provider if you often feel depressed.  Tell your health care provider if you have ever been abused or do not feel safe at home. Document Released: 12/28/2010 Document Revised: 10/29/2013 Document Reviewed: 05/16/2013 [facility] Patient Information 2015 [place], [place]. This information is not intended to replace advice given to you by your health care provider. Make sure you discuss any questions you have with your health care provider.

## 2014-03-13 NOTE — Telephone Encounter (Signed)
Sent mychart message

## 2014-03-14 NOTE — Progress Notes (Signed)
Patient ID: Kathleen Powers, female   DOB: 29-Mar-1951, 63 y.o.   MRN: [id number]   The patient is here for [facility] to Medicare examination and management of other chronic and acute problems.   The risk factors are reflected in the social history.  The roster of all physicians providing medical care to patient - is listed in the Snapshot section of the chart.  Activities of daily living:  The patient is 100% independent in all ADLs: dressing, toileting, feeding as well as independent mobility  Home safety : The patient has smoke detectors in the home. They wear seatbelts.  There are no firearms at home. There is no violence in the home.   There is no risks for hepatitis, STDs or HIV. There is no   history of blood transfusion. They have no travel history to infectious disease endemic areas of the world.  The patient has seen their dentist in the last six month. They have seen their eye doctor in the last year. They admit to slight hearing difficulty with regard to whispered voices and some television programs.  They have deferred audiologic testing in the last year.  They do not  have excessive sun exposure. Discussed the need for sun protection: hats, long sleeves and use of sunscreen if there is significant sun exposure.   Diet: the importance of a healthy diet is discussed. They do have a healthy diet.  The benefits of regular aerobic exercise were discussed. She walks 4 times per week ,  20 minutes.   Depression screen: there are no signs or vegative symptoms of depression- irritability, change in appetite, anhedonia, sadness/tearfullness.  Cognitive assessment: the patient manages all their financial and personal affairs and is actively engaged. They could relate day,date,year and events; recalled 2/3 objects at 3 minutes; performed clock-face test normally.  The following portions of the patient's history were reviewed and updated as appropriate: allergies, current medications, past  family history, past medical history,  past surgical history, past social history  and problem list.  Visual acuity was not assessed per patient preference since she has regular follow up with her ophthalmologist. Hearing and body mass index were assessed and reviewed.   During the course of the visit the patient was educated and counseled about appropriate screening and preventive services including : fall prevention , diabetes screening, nutrition counseling, colorectal cancer screening, and recommended immunizations.    Objective:  BP 138/78  Pulse 69  Temp(Src) 97.7 F (36.5 C) (Oral)  Resp 14  Ht 5' 1.5" (1.562 m)  Wt 88 lb (39.917 kg)  BMI 16.36 kg/m2  SpO2 99%  General appearance: alert, cooperative and appears stated age Head: Normocephalic, without obvious abnormality, atraumatic Eyes: conjunctivae/corneas clear. PERRL, EOM's intact. Fundi benign. Ears: normal TM's and external ear canals both ears Nose: Nares normal. Septum midline. Mucosa normal. No drainage or sinus tenderness. Throat: lips, mucosa, and tongue normal; teeth and gums normal Neck: no adenopathy, no carotid bruit, no JVD, supple, symmetrical, trachea midline and thyroid not enlarged, symmetric, no tenderness/mass/nodules Lungs: clear to auscultation bilaterally Breasts: normal appearance, no masses or tenderness Heart: regular rate and rhythm, S1, S2 normal, no murmur, click, rub or gallop Abdomen: soft, non-tender; bowel sounds normal; no masses,  no organomegaly Extremities: extremities normal, atraumatic, no cyanosis or edema Pulses: 2+ and symmetric Skin: Skin color, texture, turgor normal. No rashes or lesions Neurologic: Alert and oriented X 3, normal strength and tone. Normal symmetric reflexes. Normal coordination and gait.  Assessment and Plan:  Encounter for preventive health examination Welcome to  Medicare comprehensive physical exam was done. .  During the course of the visit the patient  was educated and counseled about appropriate screening and preventive services including : fall prevention , diabetes screening, nutrition counseling, colorectal cancer screening, and recommended immunizations.  Printed recommendations for health maintenance screenings was given.   Hypervitaminosis d Repeat level to be checked.  Patient is no longer taking supplements.  Discussed the current controversies surrounding the risks and benefits of calcium supplementation.  Encouraged her to increase dietary calcium through natural foods including almond/coconut milk  Cervical high risk human papillomavirus (HPV) DNA test positive In March 2015,  With referral to Dr Enzo Bi done but results not available. Patient is scheduled to see him again next month after a colposcopy was done   Osteoporosis, unspecified Repeat DEXA is due this ye at two yr interval with plans for Prolia discussed.  Discussed the current controversies surrounding the risks and benefits of calcium supplementation.  Encouraged her to increase dietary calcium through natural foods including almond/coconut milk   Updated Medication List Outpatient Encounter Prescriptions as of 03/12/2014  Medication Sig  . COD LIVER OIL PO Take 2.5 mLs by mouth daily.  . Estradiol 10 MCG TABS vaginal tablet Place 1 tablet (10 mcg total) vaginally 2 (two) times a week.  . milk thistle 175 MG tablet Take 175 mg by mouth daily.  . Multiple Vitamin (MULTIVITAMIN) tablet Take 1 tablet by mouth daily.  . Probiotic Product (PROBIOTIC DAILY PO) Take 1 capsule by mouth daily.  . Calcium Carbonate (CALCIUM 600 PO) Take 600 mg by mouth daily.  . Cholecalciferol (VITAMIN D) 1000 UNITS capsule Take 1,000 Units by mouth daily.  . TURMERIC PO Take 1 capsule by mouth daily.

## 2014-03-14 NOTE — Assessment & Plan Note (Addendum)
In March 2015,  With referral to Dr Enzo Bi done but results not available. Patient is scheduled to see him again next month after a colposcopy was done

## 2014-03-14 NOTE — Assessment & Plan Note (Signed)
Repeat level to be checked.  Patient is no longer taking supplements.  Discussed the current controversies surrounding the risks and benefits of calcium supplementation.  Encouraged her to increase dietary calcium through natural foods including almond/coconut milk

## 2014-03-14 NOTE — Assessment & Plan Note (Signed)
Welcome to  Medicare comprehensive physical exam was done. .  During the course of the visit the patient was educated and counseled about appropriate screening and preventive services including : fall prevention , diabetes screening, nutrition counseling, colorectal cancer screening, and recommended immunizations.  Printed recommendations for health maintenance screenings was given.

## 2014-03-14 NOTE — Assessment & Plan Note (Signed)
Repeat DEXA is due this ye at two yr interval with plans for Prolia discussed.  Discussed the current controversies surrounding the risks and benefits of calcium supplementation.  Encouraged her to increase dietary calcium through natural foods including almond/coconut milk

## 2014-03-15 NOTE — Telephone Encounter (Signed)
Mailed unread message to pt  

## 2014-03-19 ENCOUNTER — Telehealth: Payer: Self-pay | Admitting: *Deleted

## 2014-03-19 ENCOUNTER — Other Ambulatory Visit (INDEPENDENT_AMBULATORY_CARE_PROVIDER_SITE_OTHER): Payer: PRIVATE HEALTH INSURANCE

## 2014-03-19 DIAGNOSIS — D7589 Other specified diseases of blood and blood-forming organs: Secondary | ICD-10-CM

## 2014-03-19 DIAGNOSIS — R748 Abnormal levels of other serum enzymes: Secondary | ICD-10-CM

## 2014-03-19 DIAGNOSIS — Z1159 Encounter for screening for other viral diseases: Secondary | ICD-10-CM

## 2014-03-19 DIAGNOSIS — R634 Abnormal weight loss: Secondary | ICD-10-CM

## 2014-03-19 DIAGNOSIS — E785 Hyperlipidemia, unspecified: Secondary | ICD-10-CM

## 2014-03-19 LAB — CBC WITH DIFFERENTIAL/PLATELET
BASOS ABS: 0 10*3/uL (ref 0.0–0.1)
Basophils Relative: 0.6 % (ref 0.0–3.0)
EOS PCT: 5.9 % — AB (ref 0.0–5.0)
Eosinophils Absolute: 0.2 10*3/uL (ref 0.0–0.7)
HCT: 37.3 % (ref 36.0–46.0)
Hemoglobin: 12.6 g/dL (ref 12.0–15.0)
Lymphocytes Relative: 48 % — ABNORMAL HIGH (ref 12.0–46.0)
Lymphs Abs: 2 10*3/uL (ref 0.7–4.0)
MCHC: 33.7 g/dL (ref 30.0–36.0)
MCV: 102.6 fl — ABNORMAL HIGH (ref 78.0–100.0)
MONOS PCT: 8.1 % (ref 3.0–12.0)
Monocytes Absolute: 0.3 10*3/uL (ref 0.1–1.0)
Neutro Abs: 1.5 10*3/uL (ref 1.4–7.7)
Neutrophils Relative %: 37.4 % — ABNORMAL LOW (ref 43.0–77.0)
PLATELETS: 264 10*3/uL (ref 150.0–400.0)
RBC: 3.63 Mil/uL — ABNORMAL LOW (ref 3.87–5.11)
RDW: 13.6 % (ref 11.5–15.5)
WBC: 4.1 10*3/uL (ref 4.0–10.5)

## 2014-03-19 LAB — COMPREHENSIVE METABOLIC PANEL
ALBUMIN: 4.4 g/dL (ref 3.5–5.2)
ALT: 30 U/L (ref 0–35)
AST: 41 U/L — ABNORMAL HIGH (ref 0–37)
Alkaline Phosphatase: 49 U/L (ref 39–117)
BUN: 18 mg/dL (ref 6–23)
CO2: 27 mEq/L (ref 19–32)
Calcium: 8.9 mg/dL (ref 8.4–10.5)
Chloride: 105 mEq/L (ref 96–112)
Creatinine, Ser: 0.4 mg/dL (ref 0.4–1.2)
GFR: 160.84 mL/min (ref >60.00)
Glucose, Bld: 80 mg/dL (ref 70–99)
Potassium: 3.8 mEq/L (ref 3.5–5.1)
SODIUM: 139 meq/L (ref 135–145)
TOTAL PROTEIN: 7.2 g/dL (ref 6.0–8.3)
Total Bilirubin: 0.5 mg/dL (ref 0.2–1.2)

## 2014-03-19 LAB — LIPID PANEL
CHOLESTEROL: 238 mg/dL — AB (ref 0–200)
HDL: 103.8 mg/dL (ref >39.00)
LDL CALC: 122 mg/dL — AB (ref 0–99)
NONHDL: 134.2
Total CHOL/HDL Ratio: 2
Triglycerides: 59 mg/dL (ref 0.0–149.0)
VLDL: 11.8 mg/dL (ref 0.0–40.0)

## 2014-03-19 LAB — TSH: TSH: 3.02 u[IU]/mL (ref 0.35–4.50)

## 2014-03-19 NOTE — Telephone Encounter (Signed)
Pt would like a vit. D?

## 2014-03-19 NOTE — Telephone Encounter (Signed)
No thanks  

## 2014-03-20 LAB — HEPATITIS C ANTIBODY: HCV AB: NEGATIVE

## 2014-03-23 ENCOUNTER — Encounter: Payer: Self-pay | Admitting: Internal Medicine

## 2014-03-23 DIAGNOSIS — D7589 Other specified diseases of blood and blood-forming organs: Secondary | ICD-10-CM | POA: Insufficient documentation

## 2014-03-23 NOTE — Addendum Note (Signed)
Addended by: Crecencio Mc on: 03/23/2014 09:28 PM   Modules accepted: Orders

## 2014-03-25 ENCOUNTER — Other Ambulatory Visit (INDEPENDENT_AMBULATORY_CARE_PROVIDER_SITE_OTHER): Payer: PRIVATE HEALTH INSURANCE

## 2014-03-25 ENCOUNTER — Other Ambulatory Visit: Payer: Self-pay | Admitting: *Deleted

## 2014-03-25 ENCOUNTER — Telehealth: Payer: Self-pay

## 2014-03-25 DIAGNOSIS — R748 Abnormal levels of other serum enzymes: Secondary | ICD-10-CM

## 2014-03-25 DIAGNOSIS — E673 Hypervitaminosis D: Secondary | ICD-10-CM

## 2014-03-25 DIAGNOSIS — D7589 Other specified diseases of blood and blood-forming organs: Secondary | ICD-10-CM

## 2014-03-25 LAB — HEPATIC FUNCTION PANEL
ALT: 29 U/L (ref 0–35)
AST: 35 U/L (ref 0–37)
Albumin: 4.8 g/dL (ref 3.5–5.2)
Alkaline Phosphatase: 51 U/L (ref 39–117)
BILIRUBIN DIRECT: 0.1 mg/dL (ref 0.0–0.3)
TOTAL PROTEIN: 7.4 g/dL (ref 6.0–8.3)
Total Bilirubin: 0.7 mg/dL (ref 0.2–1.2)

## 2014-03-25 NOTE — Telephone Encounter (Signed)
The patient is coming in this afternoon for lab work. She is hoping an order can be added to check her Vitamin D level

## 2014-03-25 NOTE — Telephone Encounter (Signed)
It has been added.

## 2014-03-25 NOTE — Telephone Encounter (Signed)
ok 

## 2014-03-26 ENCOUNTER — Encounter: Payer: Self-pay | Admitting: Internal Medicine

## 2014-03-26 LAB — VITAMIN D 25 HYDROXY (VIT D DEFICIENCY, FRACTURES): VITD: 60.11 ng/mL (ref 30.00–100.00)

## 2014-03-26 LAB — FOLATE RBC: RBC Folate: 837 ng/mL (ref >280)

## 2014-03-26 LAB — VITAMIN B12: Vitamin B-12: 520 pg/mL (ref 211–911)

## 2014-03-27 ENCOUNTER — Encounter: Payer: Self-pay | Admitting: Internal Medicine

## 2014-09-06 DIAGNOSIS — Z86010 Personal history of colonic polyps: Secondary | ICD-10-CM | POA: Insufficient documentation

## 2014-09-17 ENCOUNTER — Ambulatory Visit: Payer: Self-pay | Admitting: Internal Medicine

## 2014-09-17 LAB — HM MAMMOGRAPHY: HM Mammogram: NEGATIVE

## 2014-09-30 ENCOUNTER — Ambulatory Visit
Admit: 2014-09-30 | Disposition: A | Discharge status: Home / Self Care | Payer: Self-pay | Attending: Gastroenterology | Admitting: Gastroenterology

## 2014-09-30 LAB — HM COLONOSCOPY

## 2014-10-04 ENCOUNTER — Encounter: Payer: Self-pay | Admitting: *Deleted

## 2014-10-16 LAB — HM PAP SMEAR: HM Pap smear: NEGATIVE

## 2014-10-21 ENCOUNTER — Encounter: Payer: Self-pay | Admitting: Internal Medicine

## 2014-10-21 LAB — SURGICAL PATHOLOGY

## 2015-04-17 ENCOUNTER — Encounter: Payer: Self-pay | Admitting: Obstetrics and Gynecology

## 2015-04-17 ENCOUNTER — Ambulatory Visit (INDEPENDENT_AMBULATORY_CARE_PROVIDER_SITE_OTHER): Payer: Medicare Other | Admitting: Obstetrics and Gynecology

## 2015-04-17 VITALS — BP 160/72 | HR 64 | Ht 62.0 in | Wt 91.0 lb

## 2015-04-17 DIAGNOSIS — R87610 Atypical squamous cells of undetermined significance on cytologic smear of cervix (ASC-US): Secondary | ICD-10-CM

## 2015-04-17 DIAGNOSIS — R87810 Cervical high risk human papillomavirus (HPV) DNA test positive: Secondary | ICD-10-CM

## 2015-04-17 NOTE — Progress Notes (Signed)
Patient ID: Kathleen Powers, female   DOB: 01/26/1951, 64 y.o.   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  Patient presents for 6 month interval Pap smear.  Previous Pap smear on 10/16/2014 was negative/negative. Patient denies any vaginal bleeding or discharge.  Past medical history, past surgical history, past problem list, medications, and allergies are reviewed and updated.  Review of systems: Per HPI  OBJECTIVE: BP 160/72 mmHg  Pulse 64  Ht 5\' 2"  (1.575 m)  Wt 91 lb (41.277 kg)  BMI 16.64 kg/m2 Pleasant, thin, elderly female in no acute distress.  Pelvic exam: External genitalia-normal external genitalia with the exception of mild atrophy. BUS: Negative. Vagina: Atrophy present. Cervix: Stenotic os; no lesions Uterus: Not examined.  IMPRESSION: 1.  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 2.  Vaginal atrophy. 3.  No cervical lesions.  PLAN: 1.  Pap/HPV. 2.  If testing is negative/negative, will have patient proceed with yearly Pap smears.  Brayton Mars, MD

## 2015-04-17 NOTE — Patient Instructions (Signed)
1.  Pap smear is completed today. 2.  Patient will have compounding prescription from [place] be sent to [place] for authorization for her vaginal estriol medication 3.  Return in 6 months for annual gynecologic physical

## 2015-04-20 LAB — PAP IG AND HPV HIGH-RISK: PAP Smear Comment: 0

## 2015-05-12 ENCOUNTER — Other Ambulatory Visit: Payer: Self-pay

## 2015-05-12 DIAGNOSIS — N952 Postmenopausal atrophic vaginitis: Secondary | ICD-10-CM

## 2015-05-12 MED ORDER — ESTRADIOL 10 MCG VA TABS: 1.0000 | ORAL_TABLET | VAGINAL | Status: DC

## 2015-10-16 ENCOUNTER — Ambulatory Visit (INDEPENDENT_AMBULATORY_CARE_PROVIDER_SITE_OTHER): Payer: Medicare Other | Admitting: Obstetrics and Gynecology

## 2015-10-16 ENCOUNTER — Encounter: Payer: Self-pay | Admitting: Obstetrics and Gynecology

## 2015-10-16 VITALS — BP 157/74 | HR 68 | Ht 62.0 in | Wt 91.2 lb

## 2015-10-16 DIAGNOSIS — Z1239 Encounter for other screening for malignant neoplasm of breast: Secondary | ICD-10-CM

## 2015-10-16 DIAGNOSIS — N952 Postmenopausal atrophic vaginitis: Secondary | ICD-10-CM

## 2015-10-16 DIAGNOSIS — Z780 Asymptomatic menopausal state: Secondary | ICD-10-CM | POA: Diagnosis not present

## 2015-10-16 DIAGNOSIS — Z124 Encounter for screening for malignant neoplasm of cervix: Secondary | ICD-10-CM | POA: Diagnosis not present

## 2015-10-16 MED ORDER — ESTRADIOL 10 MCG VA TABS: 1.0000 | ORAL_TABLET | VAGINAL | Status: DC

## 2015-10-16 NOTE — Progress Notes (Signed)
Patient ID: Kathleen Powers, female   DOB: Oct 09, 1950, 65 y.o.   MRN: [id number] ANNUAL PREVENTATIVE CARE GYN  ENCOUNTER NOTE  Subjective:       Kathleen Powers is a 65 y.o. G0P0000 female here for a routine annual gynecologic exam.  Current complaints: 1.  none   Patient is using compounded vaginal estrogen therapy for atrophic vaginitis Gynecologic History No LMP recorded. Patient is postmenopausal. Contraception: post menopausal status Last Pap: 03/2015 neg/neg. Results were: normal Last mammogram: 2016. Results were: normal-negative/negative  Obstetric History OB History  Gravida Para Term Preterm AB SAB TAB Ectopic Multiple Living  0 0 0 0 0 0 0 0 0 0         Past Medical History  Diagnosis Date  . Uterine fibroid   . HPV (human papilloma virus) infection   . Vitamin D deficiency   . Uterine fibroid   . Osteoporosis   . Postmenopausal     Past Surgical History  Procedure Laterality Date  . Cryoablation  1970    Current Outpatient Prescriptions on File Prior to Visit  Medication Sig Dispense Refill  . Estradiol 10 MCG TABS vaginal tablet Place 1 tablet (10 mcg total) vaginally 2 (two) times a week. 8 tablet 5  . milk thistle 175 MG tablet Take 175 mg by mouth daily.    . Multiple Vitamin (MULTI-VITAMINS) TABS Take by mouth.     No current facility-administered medications on file prior to visit.    Allergies  Allergen Reactions  . Codeine Nausea And Vomiting    Social History   Social History  . Marital Status: Married    Spouse Name: N/A  . Number of Children: N/A  . Years of Education: N/A   Occupational History  . Not on file.   Social History Main Topics  . Smoking status: Former Research scientist (life sciences)  . Smokeless tobacco: Not on file  . Alcohol Use: Yes  . Drug Use: No  . Sexual Activity: Not Currently    Birth Control/ Protection: Post-menopausal   Other Topics Concern  . Not on file   Social History Narrative    Family History  Problem  Relation Age of Onset  . Cancer Mother     lung  . Heart disease Mother   . COPD Mother   . Osteoporosis Mother   . Heart disease Father     congestive heart failure, valvular cardiomyopathy  . Cancer Maternal Grandmother 87    ovarian  . Aneurysm Maternal Grandfather   . Heart attack Paternal Grandfather   . Heart attack Maternal Uncle   . Diabetes Neg Hx     The following portions of the patient's history were reviewed and updated as appropriate: allergies, current medications, past family history, past medical history, past social history, past surgical history and problem list.  Review of Systems ROS Review of Systems - General ROS: negative for - chills, fatigue, fever, hot flashes, night sweats, weight gain or weight loss Psychological ROS: negative for - anxiety, decreased libido, depression, mood swings, physical abuse or sexual abuse Ophthalmic ROS: negative for - blurry vision, eye pain or loss of vision ENT ROS: negative for - headaches, hearing change, visual changes or vocal changes Allergy and Immunology ROS: negative for - hives, itchy/watery eyes or seasonal allergies Hematological and Lymphatic ROS: negative for - bleeding problems, bruising, swollen lymph nodes or weight loss Endocrine ROS: negative for - galactorrhea, hair pattern changes, hot flashes, malaise/lethargy, mood swings, palpitations, polydipsia/polyuria, skin changes,  temperature intolerance or unexpected weight changes Breast ROS: negative for - new or changing breast lumps or nipple discharge Respiratory ROS: negative for - cough or shortness of breath Cardiovascular ROS: negative for - chest pain, irregular heartbeat, palpitations or shortness of breath Gastrointestinal ROS: no abdominal pain, change in bowel habits, or black or bloody stools Genito-Urinary ROS: no dysuria, trouble voiding, or hematuria Musculoskeletal ROS: negative for - joint pain or joint stiffness Neurological ROS: negative for  - bowel and bladder control changes Dermatological ROS: negative for rash and skin lesion changes   Objective:   BP 157/74 mmHg  Pulse 68  Ht 5\' 2"  (1.575 m)  Wt 91 lb 3.2 oz (41.368 kg)  BMI 16.68 kg/m2 CONSTITUTIONAL: Well-developed, well-nourished female in no acute distress.  PSYCHIATRIC: Normal mood and affect. Normal behavior. Normal judgment and thought content. [place]: Alert and oriented to person, place, and time. Normal muscle tone coordination. No cranial nerve deficit noted. HENT:  Normocephalic, atraumatic, External right and left ear normal. Oropharynx is clear and moist EYES: Conjunctivae and EOM are normal. Pupils are equal, round, and reactive to light. No scleral icterus.  NECK: Normal range of motion, supple, no masses.  Normal thyroid.  SKIN: Skin is warm and dry. No rash noted. Not diaphoretic. No erythema. No pallor. CARDIOVASCULAR: Normal heart rate noted, regular rhythm, no murmur. RESPIRATORY: Clear to auscultation bilaterally. Effort and breath sounds normal, no problems with respiration noted. BREASTS: Symmetric in size. No masses, skin changes, nipple drainage, or lymphadenopathy. ABDOMEN: Soft, normal bowel sounds, no distention noted.  No tenderness, rebound or guarding.  BLADDER: Normal PELVIC:  External Genitalia: Normal  BUS: Normal  Vagina: Mild atrophy  Cervix: Normal; no lesions  Uterus: Normal; small midplane mobile and nontender  Adnexa: Normal  RV: External Exam NormaI, No Rectal Masses and Normal Sphincter tone  MUSCULOSKELETAL: Normal range of motion. No tenderness.  No cyanosis, clubbing, or edema.  2+ distal pulses. LYMPHATIC: No Axillary, Supraclavicular, or Inguinal Adenopathy.    Assessment:   Annual gynecologic examination 65 y.o. Contraception: post menopausal status bmi- 16 Menopausal state, asymptomatic Vaginal atrophy, mild, treated with vaginal estrogen compounded tablets History of ASCUS/HPV Pap smear; last Pap smear in  October 2016 negative/negative Osteoporosis   Plan:  Pap: Deferred till 2018 Mammogram: Ordered Stool Guaiac Testing:  Pt declines. Status post colonoscopy 09/30/2014 Labs: thru pcp Routine preventative health maintenance measures emphasized: Exercise/Diet/Weight control, Tobacco Warnings, Alcohol/Substance use risks and Safe Sex Vaginal estrogen pills (compounded) refilled Return to [place], CMA  Brayton Mars, MD  Note: This dictation was prepared with Dragon dictation along with smaller phrase technology. Any transcriptional errors that result from this process are unintentional.

## 2015-10-16 NOTE — Patient Instructions (Signed)
1. Pap smear not done this year. 2. Mammogram ordered 3. Stool guaiac cards testing was not done this year because of  colonoscopy in April 2016 4. Recommend calcium 1200 mg a day. Source of calcium is calcium citrate (Citracal) or Tums or Viactiv chews 5. Continue with healthy eating and exercise 6. Return in 1 year 7. Compound vaginal estrogen prescription is refilled

## 2015-10-20 ENCOUNTER — Other Ambulatory Visit: Payer: Self-pay

## 2015-11-06 ENCOUNTER — Ambulatory Visit
Admission: RE | Admit: 2015-11-06 | Discharge: 2015-11-06 | Disposition: A | Discharge status: Home / Self Care | Payer: Medicare Other | Source: Ambulatory Visit | Attending: Obstetrics and Gynecology | Admitting: Obstetrics and Gynecology

## 2015-11-06 ENCOUNTER — Other Ambulatory Visit: Payer: Self-pay | Admitting: Obstetrics and Gynecology

## 2015-11-06 DIAGNOSIS — Z1239 Encounter for other screening for malignant neoplasm of breast: Secondary | ICD-10-CM

## 2015-11-06 DIAGNOSIS — Z1231 Encounter for screening mammogram for malignant neoplasm of breast: Secondary | ICD-10-CM | POA: Diagnosis present

## 2016-10-11 ENCOUNTER — Other Ambulatory Visit: Payer: Self-pay | Admitting: Obstetrics and Gynecology

## 2016-10-11 DIAGNOSIS — Z1231 Encounter for screening mammogram for malignant neoplasm of breast: Secondary | ICD-10-CM

## 2016-10-21 ENCOUNTER — Encounter: Payer: Medicare Other | Admitting: Obstetrics and Gynecology

## 2016-10-22 ENCOUNTER — Ambulatory Visit (INDEPENDENT_AMBULATORY_CARE_PROVIDER_SITE_OTHER): Payer: Medicare Other | Admitting: Obstetrics and Gynecology

## 2016-10-22 ENCOUNTER — Encounter: Payer: Self-pay | Admitting: Obstetrics and Gynecology

## 2016-10-22 VITALS — BP 150/64 | HR 66 | Ht 62.0 in | Wt 89.8 lb

## 2016-10-22 DIAGNOSIS — R87610 Atypical squamous cells of undetermined significance on cytologic smear of cervix (ASC-US): Secondary | ICD-10-CM

## 2016-10-22 DIAGNOSIS — Z01419 Encounter for gynecological examination (general) (routine) without abnormal findings: Secondary | ICD-10-CM | POA: Diagnosis not present

## 2016-10-22 DIAGNOSIS — Z780 Asymptomatic menopausal state: Secondary | ICD-10-CM

## 2016-10-22 DIAGNOSIS — N952 Postmenopausal atrophic vaginitis: Secondary | ICD-10-CM | POA: Diagnosis not present

## 2016-10-22 NOTE — Patient Instructions (Signed)
1. Pap smear is performed today 2. Mammogram is ordered 3. Stool guaiac cards are declined for colon cancer screening 4. Estriol 1 mg capsules intravaginal twice a week (compounded) refilled 5. Encourage calcium rich food intake (patient declines supplemental calcium intake) 6. Return in 1 year for annual exam 7. Screening labs for primary care   Health Maintenance for Postmenopausal Women Menopause is a normal process in which your reproductive ability comes to an end. This process happens gradually over a span of months to years, usually between the ages of 47 and 22. Menopause is complete when you have missed 12 consecutive menstrual periods. It is important to talk with your health care provider about some of the most common conditions that affect postmenopausal women, such as heart disease, cancer, and bone loss (osteoporosis). Adopting a healthy lifestyle and getting preventive care can help to promote your health and wellness. Those actions can also lower your chances of developing some of these common conditions. What should I know about menopause? During menopause, you may experience a number of symptoms, such as:  Moderate-to-severe hot flashes.  Night sweats.  Decrease in sex drive.  Mood swings.  Headaches.  Tiredness.  Irritability.  Memory problems.  Insomnia. Choosing to treat or not to treat menopausal changes is an individual decision that you make with your health care provider. What should I know about hormone replacement therapy and supplements? Hormone therapy products are effective for treating symptoms that are associated with menopause, such as hot flashes and night sweats. Hormone replacement carries certain risks, especially as you become older. If you are thinking about using estrogen or estrogen with progestin treatments, discuss the benefits and risks with your health care provider. What should I know about heart disease and stroke? Heart disease,  heart attack, and stroke become more likely as you age. This may be due, in part, to the hormonal changes that your body experiences during menopause. These can affect how your body processes dietary fats, triglycerides, and cholesterol. Heart attack and stroke are both medical emergencies. There are many things that you can do to help prevent heart disease and stroke:  Have your blood pressure checked at least every 1-2 years. High blood pressure causes heart disease and increases the risk of stroke.  If you are 92-8 years old, ask your health care provider if you should take aspirin to prevent a heart attack or a stroke.  Do not use any tobacco products, including cigarettes, chewing tobacco, or electronic cigarettes. If you need help quitting, ask your health care provider.  It is important to eat a healthy diet and maintain a healthy weight.  Be sure to include plenty of vegetables, fruits, low-fat dairy products, and lean protein.  Avoid eating foods that are high in solid fats, added sugars, or salt (sodium).  Get regular exercise. This is one of the most important things that you can do for your health.  Try to exercise for at least 150 minutes each week. The type of exercise that you do should increase your heart rate and make you sweat. This is known as moderate-intensity exercise.  Try to do strengthening exercises at least twice each week. Do these in addition to the moderate-intensity exercise.  Know your numbers.Ask your health care provider to check your cholesterol and your blood glucose. Continue to have your blood tested as directed by your health care provider. What should I know about cancer screening? There are several types of cancer. Take the following steps to  reduce your risk and to catch any cancer development as early as possible. Breast Cancer  Practice breast self-awareness.  This means understanding how your breasts normally appear and feel.  It also means  doing regular breast self-exams. Let your health care provider know about any changes, no matter how small.  If you are 78 or older, have a clinician do a breast exam (clinical breast exam or CBE) every year. Depending on your age, family history, and medical history, it may be recommended that you also have a yearly breast X-ray (mammogram).  If you have a family history of breast cancer, talk with your health care provider about genetic screening.  If you are at high risk for breast cancer, talk with your health care provider about having an MRI and a mammogram every year.  Breast cancer (BRCA) gene test is recommended for women who have family members with BRCA-related cancers. Results of the assessment will determine the need for genetic counseling and BRCA1 and for BRCA2 testing. BRCA-related cancers include these types:  Breast. This occurs in males or females.  Ovarian.  Tubal. This may also be called fallopian tube cancer.  Cancer of the abdominal or pelvic lining (peritoneal cancer).  Prostate.  Pancreatic. Cervical, Uterine, and Ovarian Cancer  Your health care provider may recommend that you be screened regularly for cancer of the pelvic organs. These include your ovaries, uterus, and vagina. This screening involves a pelvic exam, which includes checking for microscopic changes to the surface of your cervix (Pap test).  For women ages 21-65, health care providers may recommend a pelvic exam and a Pap test every three years. For women ages 33-65, they may recommend the Pap test and pelvic exam, combined with testing for human papilloma virus (HPV), every five years. Some types of HPV increase your risk of cervical cancer. Testing for HPV may also be done on women of any age who have unclear Pap test results.  Other health care providers may not recommend any screening for nonpregnant women who are considered low risk for pelvic cancer and have no symptoms. Ask your health care  provider if a screening pelvic exam is right for you.  If you have had past treatment for cervical cancer or a condition that could lead to cancer, you need Pap tests and screening for cancer for at least 20 years after your treatment. If Pap tests have been discontinued for you, your risk factors (such as having a new sexual partner) need to be reassessed to determine if you should start having screenings again. Some women have medical problems that increase the chance of getting cervical cancer. In these cases, your health care provider may recommend that you have screening and Pap tests more often.  If you have a family history of uterine cancer or ovarian cancer, talk with your health care provider about genetic screening.  If you have vaginal bleeding after reaching menopause, tell your health care provider.  There are currently no reliable tests available to screen for ovarian cancer. Lung Cancer  Lung cancer screening is recommended for adults 26-62 years old who are at high risk for lung cancer because of a history of smoking. A yearly low-dose CT scan of the lungs is recommended if you:  Currently smoke.  Have a history of at least 30 pack-years of smoking and you currently smoke or have quit within the past 15 years. A pack-year is smoking an average of one pack of cigarettes per day for one year.  Yearly screening should:  Continue until it has been 15 years since you quit.  Stop if you develop a health problem that would prevent you from having lung cancer treatment. Colorectal Cancer  This type of cancer can be detected and can often be prevented.  Routine colorectal cancer screening usually begins at age 79 and continues through age 66.  If you have risk factors for colon cancer, your health care provider may recommend that you be screened at an earlier age.  If you have a family history of colorectal cancer, talk with your health care provider about genetic  screening.  Your health care provider may also recommend using home test kits to check for hidden blood in your stool.  A small camera at the end of a tube can be used to examine your colon directly (sigmoidoscopy or colonoscopy). This is done to check for the earliest forms of colorectal cancer.  Direct examination of the colon should be repeated every 5-10 years until age 56. However, if early forms of precancerous polyps or small growths are found or if you have a family history or genetic risk for colorectal cancer, you may need to be screened more often. Skin Cancer  Check your skin from head to toe regularly.  Monitor any moles. Be sure to tell your health care provider:  About any new moles or changes in moles, especially if there is a change in a mole's shape or color.  If you have a mole that is larger than the size of a pencil eraser.  If any of your family members has a history of skin cancer, especially at a young age, talk with your health care provider about genetic screening.  Always use sunscreen. Apply sunscreen liberally and repeatedly throughout the day.  Whenever you are outside, protect yourself by wearing long sleeves, pants, a wide-brimmed hat, and sunglasses. What should I know about osteoporosis? Osteoporosis is a condition in which bone destruction happens more quickly than new bone creation. After menopause, you may be at an increased risk for osteoporosis. To help prevent osteoporosis or the bone fractures that can happen because of osteoporosis, the following is recommended:  If you are 44-6 years old, get at least 1,000 mg of calcium and at least 600 mg of vitamin D per day.  If you are older than age 89 but younger than age 79, get at least 1,200 mg of calcium and at least 600 mg of vitamin D per day.  If you are older than age 54, get at least 1,200 mg of calcium and at least 800 mg of vitamin D per day. Smoking and excessive alcohol intake increase the  risk of osteoporosis. Eat foods that are rich in calcium and vitamin D, and do weight-bearing exercises several times each week as directed by your health care provider. What should I know about how menopause affects my mental health? Depression may occur at any age, but it is more common as you become older. Common symptoms of depression include:  Low or sad mood.  Changes in sleep patterns.  Changes in appetite or eating patterns.  Feeling an overall lack of motivation or enjoyment of activities that you previously enjoyed.  Frequent crying spells. Talk with your health care provider if you think that you are experiencing depression. What should I know about immunizations? It is important that you get and maintain your immunizations. These include:  Tetanus, diphtheria, and pertussis (Tdap) booster vaccine.  Influenza every year before the flu  season begins.  Pneumonia vaccine.  Shingles vaccine. Your health care provider may also recommend other immunizations. This information is not intended to replace advice given to you by your health care provider. Make sure you discuss any questions you have with your health care provider. Document Released: 08/06/2005 Document Revised: 01/02/2016 Document Reviewed: 03/18/2015 Elsevier Interactive Patient Education  2017 [employer].

## 2016-10-22 NOTE — Progress Notes (Signed)
ANNUAL PREVENTATIVE CARE GYN  ENCOUNTER NOTE  Subjective:       Kathleen Powers is a 66 y.o. G0P0000 female here for a routine annual gynecologic exam.  Current complaints: 1.  none   Patient got married in March 2018. No major interval health issues have been identified.   Gynecologic History No LMP recorded. Patient is postmenopausal. Contraception: post menopausal status Last Pap: 03/2015 neg/neg. Results were: normal Last mammogram: 10/2015 birad 1. Results were: normal  Obstetric History OB History  Gravida Para Term Preterm AB Living  0 0 0 0 0 0  SAB TAB Ectopic Multiple Live Births  0 0 0 0          Past Medical History:  Diagnosis Date  . HPV (human papilloma virus) infection   . Osteoporosis   . Postmenopausal   . Uterine fibroid   . Uterine fibroid   . Vitamin D deficiency     Past Surgical History:  Procedure Laterality Date  . cryoablation  1970    Current Outpatient Prescriptions on File Prior to Visit  Medication Sig Dispense Refill  . Estriol POWD 1 mg by Does not apply route 2 (two) times a week.    . milk thistle 175 MG tablet Take 175 mg by mouth daily.    . Multiple Vitamin (MULTI-VITAMINS) TABS Take by mouth.     No current facility-administered medications on file prior to visit.     Allergies  Allergen Reactions  . Codeine Nausea And Vomiting    Social History   Social History  . Marital status: Married    Spouse name: N/A  . Number of children: N/A  . Years of education: N/A   Occupational History  . Not on file.   Social History Main Topics  . Smoking status: Former Research scientist (life sciences)  . Smokeless tobacco: Not on file  . Alcohol use Yes  . Drug use: No  . Sexual activity: Not Currently    Birth control/ protection: Post-menopausal   Other Topics Concern  . Not on file   Social History Narrative  . No narrative on file    Family History  Problem Relation Age of Onset  . Cancer Mother     lung  . Heart disease Mother    . COPD Mother   . Osteoporosis Mother   . Heart disease Father     congestive heart failure, valvular cardiomyopathy  . Cancer Maternal Grandmother 1    ovarian  . Aneurysm Maternal Grandfather   . Heart attack Paternal Grandfather   . Heart attack Maternal Uncle   . Diabetes Neg Hx     The following portions of the patient's history were reviewed and updated as appropriate: allergies, current medications, past family history, past medical history, past social history, past surgical history and problem list.  Review of Systems ROS Review of Systems - General ROS: negative for - chills, fatigue, fever, hot flashes, night sweats, weight gain or weight loss Psychological ROS: negative for - anxiety, decreased libido, depression, mood swings, physical abuse or sexual abuse Ophthalmic ROS: negative for - blurry vision, eye pain or loss of vision ENT ROS: negative for - headaches, hearing change, visual changes or vocal changes Allergy and Immunology ROS: negative for - hives, itchy/watery eyes or seasonal allergies Hematological and Lymphatic ROS: negative for - bleeding problems, bruising, swollen lymph nodes or weight loss Endocrine ROS: negative for - galactorrhea, hair pattern changes, hot flashes, malaise/lethargy, mood swings, palpitations, polydipsia/polyuria, skin changes,  temperature intolerance or unexpected weight changes Breast ROS: negative for - new or changing breast lumps or nipple discharge Respiratory ROS: negative for - cough or shortness of breath Cardiovascular ROS: negative for - chest pain, irregular heartbeat, palpitations or shortness of breath Gastrointestinal ROS: no abdominal pain, change in bowel habits, or black or bloody stools Genito-Urinary ROS: no dysuria, trouble voiding, or hematuria Musculoskeletal ROS: negative for - joint pain or joint stiffness Neurological ROS: negative for - bowel and bladder control changes Dermatological ROS: negative for rash  and skin lesion changes   Objective:   BP (!) 150/64   Pulse 66   Ht 5\' 2"  (1.575 m)   Wt 89 lb 12.8 oz (40.7 kg)   BMI 16.42 kg/m  CONSTITUTIONAL: Well-developed, well-nourished female in no acute distress.  PSYCHIATRIC: Normal mood and affect. Normal behavior. Normal judgment and thought content. [place]: Alert and oriented to person, place, and time. Normal muscle tone coordination. No cranial nerve deficit noted. HENT:  Normocephalic, atraumatic, External right and left ear normal. Oropharynx is clear and moist EYES: Conjunctivae and EOM are normal. No scleral icterus.  NECK: Normal range of motion, supple, no masses.  Normal thyroid.  SKIN: Skin is warm and dry. No rash noted. Not diaphoretic. No erythema. No pallor. CARDIOVASCULAR: Normal heart rate noted, regular rhythm, no murmur. RESPIRATORY: Clear to auscultation bilaterally. Effort and breath sounds normal, no problems with respiration noted. BREASTS: Symmetric in size. No masses, skin changes, nipple drainage, or lymphadenopathy. ABDOMEN: Soft, normal bowel sounds, no distention noted.  No tenderness, rebound or guarding.  BLADDER: Normal PELVIC:  External Genitalia: Normal  BUS: Normal  Vagina: Normal; mild atrophy  Cervix: Normal; stenotic; no cervical motion tenderness  Uterus: Normal; midplane, mobile, small, nontender  Adnexa: Normal; nonpalpable nontender  RV: External Exam NormaI, No Rectal Masses and Normal Sphincter tone  MUSCULOSKELETAL: Normal range of motion. No tenderness.  No cyanosis, clubbing, or edema.  2+ distal pulses. LYMPHATIC: No Axillary, Supraclavicular, or Inguinal Adenopathy.    Assessment:   Annual gynecologic examination 66 y.o. Contraception: post menopausal status bmi-16 History of osteoporosis Vaginal atrophy, minimally symptomatic History of abnormal Pap smears  Plan:  Pap: Pap Co Test Mammogram: scheduled for 10/2016 Stool Guaiac Testing:  declined Labs: thru pcp Routine  preventative health maintenance measures emphasized: Exercise/Diet/Weight control, Tobacco Warnings and Alcohol/Substance use risks Estriol capsules; twice weekly intravaginal refilled (compounded) Return to [place], CMA  Brayton Mars, MD  Note: This dictation was prepared with Dragon dictation along with smaller phrase technology. Any transcriptional errors that result from this process are unintentional.

## 2016-10-25 LAB — PAP IG W/ RFLX HPV ASCU: PAP Smear Comment: 0

## 2016-11-11 ENCOUNTER — Ambulatory Visit
Admission: RE | Admit: 2016-11-11 | Discharge: 2016-11-11 | Disposition: A | Discharge status: Home / Self Care | Payer: Medicare Other | Source: Ambulatory Visit | Attending: Obstetrics and Gynecology | Admitting: Obstetrics and Gynecology

## 2016-11-11 DIAGNOSIS — Z1231 Encounter for screening mammogram for malignant neoplasm of breast: Secondary | ICD-10-CM | POA: Diagnosis not present

## 2017-10-10 ENCOUNTER — Other Ambulatory Visit: Payer: Self-pay | Admitting: Family Medicine

## 2017-10-10 DIAGNOSIS — Z1231 Encounter for screening mammogram for malignant neoplasm of breast: Secondary | ICD-10-CM

## 2017-10-24 NOTE — Progress Notes (Deleted)
ANNUAL PREVENTATIVE CARE GYN  ENCOUNTER NOTE  Subjective:       Kathleen Powers is a 67 y.o. G0P0000 female here for a routine annual gynecologic exam.  Current complaints: 1.  none    No major interval health issues have been identified.   Gynecologic History No LMP recorded. Patient is postmenopausal. Contraception: post menopausal status Last Pap: 03/2015 neg/neg. 10/22/2016 neg Results were: normal Last mammogram: 10/2016 birad 1. Results were: normal  Obstetric History OB History  Gravida Para Term Preterm AB Living  0 0 0 0 0 0  SAB TAB Ectopic Multiple Live Births  0 0 0 0      Past Medical History:  Diagnosis Date  . HPV (human papilloma virus) infection   . Osteoporosis   . Postmenopausal   . Uterine fibroid   . Uterine fibroid   . Vitamin D deficiency     Past Surgical History:  Procedure Laterality Date  . cryoablation  1970    Current Outpatient Medications on File Prior to Visit  Medication Sig Dispense Refill  . Estriol POWD 1 mg by Does not apply route 2 (two) times a week.    . milk thistle 175 MG tablet Take 175 mg by mouth daily.    . Multiple Vitamin (MULTI-VITAMINS) TABS Take by mouth.     No current facility-administered medications on file prior to visit.     Allergies  Allergen Reactions  . Codeine Nausea And Vomiting    Social History   Socioeconomic History  . Marital status: Married    Spouse name: Not on file  . Number of children: Not on file  . Years of education: Not on file  . Highest education level: Not on file  Occupational History  . Not on file  Social Needs  . Financial resource strain: Not on file  . Food insecurity:    Worry: Not on file    Inability: Not on file  . Transportation needs:    Medical: Not on file    Non-medical: Not on file  Tobacco Use  . Smoking status: Never Smoker  . Smokeless tobacco: Never Used  Substance and Sexual Activity  . Alcohol use: Yes    Comment: weekly  . Drug use: No   . Sexual activity: Yes    Birth control/protection: Post-menopausal  Lifestyle  . Physical activity:    Days per week: Not on file    Minutes per session: Not on file  . Stress: Not on file  Relationships  . Social connections:    Talks on phone: Not on file    Gets together: Not on file    Attends religious service: Not on file    Active member of club or organization: Not on file    Attends meetings of clubs or organizations: Not on file    Relationship status: Not on file  . Intimate partner violence:    Fear of current or ex partner: Not on file    Emotionally abused: Not on file    Physically abused: Not on file    Forced sexual activity: Not on file  Other Topics Concern  . Not on file  Social History Narrative  . Not on file    Family History  Problem Relation Age of Onset  . Cancer Mother        lung  . Heart disease Mother   . COPD Mother   . Osteoporosis Mother   . Heart disease Father  congestive heart failure, valvular cardiomyopathy  . Cancer Maternal Grandmother 7       ovarian  . Aneurysm Maternal Grandfather   . Heart attack Paternal Grandfather   . Heart attack Maternal Uncle   . Diabetes Neg Hx   . Ovarian cancer Neg Hx   . Breast cancer Neg Hx     The following portions of the patient's history were reviewed and updated as appropriate: allergies, current medications, past family history, past medical history, past social history, past surgical history and problem list.  Review of Systems ROS   Objective:   There were no vitals taken for this visit. CONSTITUTIONAL: Well-developed, well-nourished female in no acute distress.  PSYCHIATRIC: Normal mood and affect. Normal behavior. Normal judgment and thought content. Electra: Alert and oriented to person, place, and time. Normal muscle tone coordination. No cranial nerve deficit noted. HENT:  Normocephalic, atraumatic, External right and left ear normal. Oropharynx is clear and  moist EYES: Conjunctivae and EOM are normal. No scleral icterus.  NECK: Normal range of motion, supple, no masses.  Normal thyroid.  SKIN: Skin is warm and dry. No rash noted. Not diaphoretic. No erythema. No pallor. CARDIOVASCULAR: Normal heart rate noted, regular rhythm, no murmur. RESPIRATORY: Clear to auscultation bilaterally. Effort and breath sounds normal, no problems with respiration noted. BREASTS: Symmetric in size. No masses, skin changes, nipple drainage, or lymphadenopathy. ABDOMEN: Soft, normal bowel sounds, no distention noted.  No tenderness, rebound or guarding.  BLADDER: Normal PELVIC:  External Genitalia: Normal  BUS: Normal  Vagina: Normal; mild atrophy  Cervix: Normal; stenotic; no cervical motion tenderness  Uterus: Normal; midplane, mobile, small, nontender  Adnexa: Normal; nonpalpable nontender  RV: External Exam NormaI, No Rectal Masses and Normal Sphincter tone  MUSCULOSKELETAL: Normal range of motion. No tenderness.  No cyanosis, clubbing, or edema.  2+ distal pulses. LYMPHATIC: No Axillary, Supraclavicular, or Inguinal Adenopathy.    Assessment:   Annual gynecologic examination 67 y.o. Contraception: post menopausal status bmi-16 History of osteoporosis Vaginal atrophy, minimally symptomatic History of abnormal Pap smears  Plan:  Pap: not needed Mammogram: scheduled for 11/15/2017 Stool Guaiac Testing:  declined Labs: thru pcp Routine preventative health maintenance measures emphasized: Exercise/Diet/Weight control, Tobacco Warnings and Alcohol/Substance use risks Estriol capsules; twice weekly intravaginal refilled (compounded) Return to [place]   Note: This dictation was prepared with Dragon dictation along with smaller phrase technology. Any transcriptional errors that result from this process are unintentional.

## 2017-10-26 ENCOUNTER — Encounter: Payer: Medicare Other | Admitting: Obstetrics and Gynecology

## 2017-11-07 NOTE — Progress Notes (Signed)
ANNUAL PREVENTATIVE CARE GYN  ENCOUNTER NOTE  Subjective:       Kathleen Powers is a 67 y.o. G0P0000 female here for a routine annual gynecologic exam.  Current complaints: 1.  none   No major interval health issues have been identified.  Bowel and bladder function are normal. Patient denies any significant vasomotor symptoms. She is not taking calcium with vitamin D   Gynecologic History No LMP recorded. Patient is postmenopausal. Contraception: post menopausal status Last Pap: 03/2015 neg/neg. 10/22/2016 neg Results were: normal Last mammogram: 10/2016 birad 1. Results were: normal History of osteoporosis  Obstetric History OB History  Gravida Para Term Preterm AB Living  0 0 0 0 0 0  SAB TAB Ectopic Multiple Live Births  0 0 0 0      Past Medical History:  Diagnosis Date  . HPV (human papilloma virus) infection   . Osteoporosis   . Postmenopausal   . Uterine fibroid   . Uterine fibroid   . Vitamin D deficiency     Past Surgical History:  Procedure Laterality Date  . cryoablation  1970    Current Outpatient Medications on File Prior to Visit  Medication Sig Dispense Refill  . Estriol POWD 1 mg by Does not apply route 2 (two) times a week.    . milk thistle 175 MG tablet Take 175 mg by mouth daily.    . Multiple Vitamin (MULTI-VITAMINS) TABS Take by mouth.     No current facility-administered medications on file prior to visit.     Allergies  Allergen Reactions  . Codeine Nausea And Vomiting    Social History   Socioeconomic History  . Marital status: Married    Spouse name: Not on file  . Number of children: Not on file  . Years of education: Not on file  . Highest education level: Not on file  Occupational History  . Not on file  Social Needs  . Financial resource strain: Not on file  . Food insecurity:    Worry: Not on file    Inability: Not on file  . Transportation needs:    Medical: Not on file    Non-medical: Not on file  Tobacco  Use  . Smoking status: Never Smoker  . Smokeless tobacco: Never Used  Substance and Sexual Activity  . Alcohol use: Yes    Comment: weekly  . Drug use: No  . Sexual activity: Yes    Birth control/protection: Post-menopausal  Lifestyle  . Physical activity:    Days per week: Not on file    Minutes per session: Not on file  . Stress: Not on file  Relationships  . Social connections:    Talks on phone: Not on file    Gets together: Not on file    Attends religious service: Not on file    Active member of club or organization: Not on file    Attends meetings of clubs or organizations: Not on file    Relationship status: Not on file  . Intimate partner violence:    Fear of current or ex partner: Not on file    Emotionally abused: Not on file    Physically abused: Not on file    Forced sexual activity: Not on file  Other Topics Concern  . Not on file  Social History Narrative  . Not on file    Family History  Problem Relation Age of Onset  . Cancer Mother  lung  . Heart disease Mother   . COPD Mother   . Osteoporosis Mother   . Heart disease Father        congestive heart failure, valvular cardiomyopathy  . Cancer Maternal Grandmother 59       ovarian  . Aneurysm Maternal Grandfather   . Heart attack Paternal Grandfather   . Heart attack Maternal Uncle   . Diabetes Neg Hx   . Ovarian cancer Neg Hx   . Breast cancer Neg Hx     The following portions of the patient's history were reviewed and updated as appropriate: allergies, current medications, past family history, past medical history, past social history, past surgical history and problem list.  Review of Systems Review of Systems  Constitutional: Negative.   HENT: Negative.   Eyes: Negative.   Respiratory: Negative.   Cardiovascular: Negative.   Gastrointestinal: Negative.   Genitourinary: Negative.   Musculoskeletal: Negative.   Skin: Negative.      Objective:  BP (!) 165/64   Pulse 62   Ht  5\' 2"  (1.575 m)   Wt 86 lb 12.8 oz (39.4 kg)   BMI 15.88 kg/m  CONSTITUTIONAL: Well-developed, well-nourished female in no acute distress.  PSYCHIATRIC: Normal mood and affect. Normal behavior. Normal judgment and thought content. Emma: Alert and oriented to person, place, and time. Normal muscle tone coordination. No cranial nerve deficit noted. HENT:  Normocephalic, atraumatic, External right and left ear normal.  EYES: Conjunctivae and EOM are normal. No scleral icterus.  NECK: Normal range of motion, supple, no masses.  Normal thyroid.  SKIN: Skin is warm and dry. No rash noted. Not diaphoretic. No erythema. No pallor. CARDIOVASCULAR: Normal heart rate noted, regular rhythm, no murmur. RESPIRATORY: Clear to auscultation bilaterally. Effort and breath sounds normal, no problems with respiration noted. BREASTS: Symmetric in size. No masses, skin changes, nipple drainage, or lymphadenopathy. ABDOMEN: Soft, normal bowel sounds, no distention noted.  No tenderness, rebound or guarding.  BLADDER: Normal PELVIC:  External Genitalia: Normal  BUS: Normal  Vagina: Normal; mild atrophy  Cervix: Normal; stenotic; no cervical motion tenderness  Uterus: Normal; midplane, mobile, small, nontender  Adnexa: Normal; nonpalpable nontender  RV: External Exam NormaI, No Rectal Masses and Normal Sphincter tone  MUSCULOSKELETAL: Normal range of motion. No tenderness.  No cyanosis, clubbing, or edema.  2+ distal pulses. LYMPHATIC: No Axillary, Supraclavicular, or Inguinal Adenopathy.    Assessment:   Annual gynecologic examination 67 y.o. Contraception: post menopausal status bmi-15 History of osteoporosis Vaginal atrophy, minimally symptomatic History of abnormal Pap smears  Plan:  Pap: pap w/rflx- per pt requst Mammogram: scheduled for 11/15/2017 Stool Guaiac Testing:  declines Labs: thru pcp Calcium and vitamin D 1200 mg / 800 international units daily is recommended Routine  preventative health maintenance measures emphasized: Exercise/Diet/Weight control, Tobacco Warnings and Alcohol/Substance use risks Return to [place], CMA  Brayton Mars, MD  Note: This dictation was prepared with Dragon dictation along with smaller phrase technology. Any transcriptional errors that result from this process are unintentional.

## 2017-11-10 ENCOUNTER — Encounter: Payer: Self-pay | Admitting: Obstetrics and Gynecology

## 2017-11-10 ENCOUNTER — Ambulatory Visit (INDEPENDENT_AMBULATORY_CARE_PROVIDER_SITE_OTHER): Payer: Medicare Other | Admitting: Obstetrics and Gynecology

## 2017-11-10 VITALS — BP 165/64 | HR 62 | Ht 62.0 in | Wt 86.8 lb

## 2017-11-10 DIAGNOSIS — Z780 Asymptomatic menopausal state: Secondary | ICD-10-CM

## 2017-11-10 DIAGNOSIS — N952 Postmenopausal atrophic vaginitis: Secondary | ICD-10-CM

## 2017-11-10 DIAGNOSIS — Z1231 Encounter for screening mammogram for malignant neoplasm of breast: Secondary | ICD-10-CM | POA: Diagnosis not present

## 2017-11-10 DIAGNOSIS — Z01419 Encounter for gynecological examination (general) (routine) without abnormal findings: Secondary | ICD-10-CM | POA: Diagnosis not present

## 2017-11-10 DIAGNOSIS — R87610 Atypical squamous cells of undetermined significance on cytologic smear of cervix (ASC-US): Secondary | ICD-10-CM

## 2017-11-10 DIAGNOSIS — Z1239 Encounter for other screening for malignant neoplasm of breast: Secondary | ICD-10-CM

## 2017-11-10 NOTE — Patient Instructions (Addendum)
1.  Pap smear is done 2.  Mammogram is already scheduled 3.  Stool guaiac card testing for colon cancer is declined 4.  Screening labs are obtained through primary care 5.  Recommend calcium 1200 mg a day and vitamin D 800 international units daily 6.  Return in 1 year for annual exam   Health Maintenance for Postmenopausal Women Menopause is a normal process in which your reproductive ability comes to an end. This process happens gradually over a span of months to years, usually between the ages of 51 and 89. Menopause is complete when you have missed 12 consecutive menstrual periods. It is important to talk with your health care provider about some of the most common conditions that affect postmenopausal women, such as heart disease, cancer, and bone loss (osteoporosis). Adopting a healthy lifestyle and getting preventive care can help to promote your health and wellness. Those actions can also lower your chances of developing some of these common conditions. What should I know about menopause? During menopause, you may experience a number of symptoms, such as:  Moderate-to-severe hot flashes.  Night sweats.  Decrease in sex drive.  Mood swings.  Headaches.  Tiredness.  Irritability.  Memory problems.  Insomnia.  Choosing to treat or not to treat menopausal changes is an individual decision that you make with your health care provider. What should I know about hormone replacement therapy and supplements? Hormone therapy products are effective for treating symptoms that are associated with menopause, such as hot flashes and night sweats. Hormone replacement carries certain risks, especially as you become older. If you are thinking about using estrogen or estrogen with progestin treatments, discuss the benefits and risks with your health care provider. What should I know about heart disease and stroke? Heart disease, heart attack, and stroke become more likely as you age. This may  be due, in part, to the hormonal changes that your body experiences during menopause. These can affect how your body processes dietary fats, triglycerides, and cholesterol. Heart attack and stroke are both medical emergencies. There are many things that you can do to help prevent heart disease and stroke:  Have your blood pressure checked at least every 1-2 years. High blood pressure causes heart disease and increases the risk of stroke.  If you are 32-59 years old, ask your health care provider if you should take aspirin to prevent a heart attack or a stroke.  Do not use any tobacco products, including cigarettes, chewing tobacco, or electronic cigarettes. If you need help quitting, ask your health care provider.  It is important to eat a healthy diet and maintain a healthy weight. ? Be sure to include plenty of vegetables, fruits, low-fat dairy products, and lean protein. ? Avoid eating foods that are high in solid fats, added sugars, or salt (sodium).  Get regular exercise. This is one of the most important things that you can do for your health. ? Try to exercise for at least 150 minutes each week. The type of exercise that you do should increase your heart rate and make you sweat. This is known as moderate-intensity exercise. ? Try to do strengthening exercises at least twice each week. Do these in addition to the moderate-intensity exercise.  Know your numbers.Ask your health care provider to check your cholesterol and your blood glucose. Continue to have your blood tested as directed by your health care provider.  What should I know about cancer screening? There are several types of cancer. Take the following  steps to reduce your risk and to catch any cancer development as early as possible. Breast Cancer  Practice breast self-awareness. ? This means understanding how your breasts normally appear and feel. ? It also means doing regular breast self-exams. Let your health care provider  know about any changes, no matter how small.  If you are 60 or older, have a clinician do a breast exam (clinical breast exam or CBE) every year. Depending on your age, family history, and medical history, it may be recommended that you also have a yearly breast X-ray (mammogram).  If you have a family history of breast cancer, talk with your health care provider about genetic screening.  If you are at high risk for breast cancer, talk with your health care provider about having an MRI and a mammogram every year.  Breast cancer (BRCA) gene test is recommended for women who have family members with BRCA-related cancers. Results of the assessment will determine the need for genetic counseling and BRCA1 and for BRCA2 testing. BRCA-related cancers include these types: ? Breast. This occurs in males or females. ? Ovarian. ? Tubal. This may also be called fallopian tube cancer. ? Cancer of the abdominal or pelvic lining (peritoneal cancer). ? Prostate. ? Pancreatic.  Cervical, Uterine, and Ovarian Cancer Your health care provider may recommend that you be screened regularly for cancer of the pelvic organs. These include your ovaries, uterus, and vagina. This screening involves a pelvic exam, which includes checking for microscopic changes to the surface of your cervix (Pap test).  For women ages 21-65, health care providers may recommend a pelvic exam and a Pap test every three years. For women ages 33-65, they may recommend the Pap test and pelvic exam, combined with testing for human papilloma virus (HPV), every five years. Some types of HPV increase your risk of cervical cancer. Testing for HPV may also be done on women of any age who have unclear Pap test results.  Other health care providers may not recommend any screening for nonpregnant women who are considered low risk for pelvic cancer and have no symptoms. Ask your health care provider if a screening pelvic exam is right for you.  If you  have had past treatment for cervical cancer or a condition that could lead to cancer, you need Pap tests and screening for cancer for at least 20 years after your treatment. If Pap tests have been discontinued for you, your risk factors (such as having a new sexual partner) need to be reassessed to determine if you should start having screenings again. Some women have medical problems that increase the chance of getting cervical cancer. In these cases, your health care provider may recommend that you have screening and Pap tests more often.  If you have a family history of uterine cancer or ovarian cancer, talk with your health care provider about genetic screening.  If you have vaginal bleeding after reaching menopause, tell your health care provider.  There are currently no reliable tests available to screen for ovarian cancer.  Lung Cancer Lung cancer screening is recommended for adults 2-58 years old who are at high risk for lung cancer because of a history of smoking. A yearly low-dose CT scan of the lungs is recommended if you:  Currently smoke.  Have a history of at least 30 pack-years of smoking and you currently smoke or have quit within the past 15 years. A pack-year is smoking an average of one pack of cigarettes per day for  one year.  Yearly screening should:  Continue until it has been 15 years since you quit.  Stop if you develop a health problem that would prevent you from having lung cancer treatment.  Colorectal Cancer  This type of cancer can be detected and can often be prevented.  Routine colorectal cancer screening usually begins at age 61 and continues through age 52.  If you have risk factors for colon cancer, your health care provider may recommend that you be screened at an earlier age.  If you have a family history of colorectal cancer, talk with your health care provider about genetic screening.  Your health care provider may also recommend using home test  kits to check for hidden blood in your stool.  A small camera at the end of a tube can be used to examine your colon directly (sigmoidoscopy or colonoscopy). This is done to check for the earliest forms of colorectal cancer.  Direct examination of the colon should be repeated every 5-10 years until age 51. However, if early forms of precancerous polyps or small growths are found or if you have a family history or genetic risk for colorectal cancer, you may need to be screened more often.  Skin Cancer  Check your skin from head to toe regularly.  Monitor any moles. Be sure to tell your health care provider: ? About any new moles or changes in moles, especially if there is a change in a mole's shape or color. ? If you have a mole that is larger than the size of a pencil eraser.  If any of your family members has a history of skin cancer, especially at a young age, talk with your health care provider about genetic screening.  Always use sunscreen. Apply sunscreen liberally and repeatedly throughout the day.  Whenever you are outside, protect yourself by wearing long sleeves, pants, a wide-brimmed hat, and sunglasses.  What should I know about osteoporosis? Osteoporosis is a condition in which bone destruction happens more quickly than new bone creation. After menopause, you may be at an increased risk for osteoporosis. To help prevent osteoporosis or the bone fractures that can happen because of osteoporosis, the following is recommended:  If you are 84-10 years old, get at least 1,000 mg of calcium and at least 600 mg of vitamin D per day.  If you are older than age 67 but younger than age 91, get at least 1,200 mg of calcium and at least 600 mg of vitamin D per day.  If you are older than age 54, get at least 1,200 mg of calcium and at least 800 mg of vitamin D per day.  Smoking and excessive alcohol intake increase the risk of osteoporosis. Eat foods that are rich in calcium and vitamin  D, and do weight-bearing exercises several times each week as directed by your health care provider. What should I know about how menopause affects my mental health? Depression may occur at any age, but it is more common as you become older. Common symptoms of depression include:  Low or sad mood.  Changes in sleep patterns.  Changes in appetite or eating patterns.  Feeling an overall lack of motivation or enjoyment of activities that you previously enjoyed.  Frequent crying spells.  Talk with your health care provider if you think that you are experiencing depression. What should I know about immunizations? It is important that you get and maintain your immunizations. These include:  Tetanus, diphtheria, and pertussis (Tdap) booster  vaccine.  Influenza every year before the flu season begins.  Pneumonia vaccine.  Shingles vaccine.  Your health care provider may also recommend other immunizations. This information is not intended to replace advice given to you by your health care provider. Make sure you discuss any questions you have with your health care provider. Document Released: 08/06/2005 Document Revised: 01/02/2016 Document Reviewed: 03/18/2015 Elsevier Interactive Patient Education  [employer]. .

## 2017-11-15 ENCOUNTER — Ambulatory Visit
Admission: RE | Admit: 2017-11-15 | Discharge: 2017-11-15 | Disposition: A | Discharge status: Home / Self Care | Payer: Medicare Other | Source: Ambulatory Visit | Attending: Family Medicine | Admitting: Family Medicine

## 2017-11-15 DIAGNOSIS — Z1231 Encounter for screening mammogram for malignant neoplasm of breast: Secondary | ICD-10-CM | POA: Diagnosis present

## 2017-11-16 LAB — PAP IG W/ RFLX HPV ASCU: PAP Smear Comment: 0

## 2018-05-19 ENCOUNTER — Encounter: Payer: Self-pay | Admitting: *Deleted

## 2018-05-22 ENCOUNTER — Ambulatory Visit: Payer: Medicare Other | Admitting: Anesthesiology

## 2018-05-22 ENCOUNTER — Encounter
Admission: RE | Disposition: A | Discharge status: Home / Self Care | Payer: Self-pay | Source: Ambulatory Visit | Attending: Gastroenterology

## 2018-05-22 ENCOUNTER — Encounter: Payer: Self-pay | Admitting: *Deleted

## 2018-05-22 ENCOUNTER — Ambulatory Visit
Admission: RE | Admit: 2018-05-22 | Discharge: 2018-05-22 | Disposition: A | Discharge status: Home / Self Care | Payer: Medicare Other | Source: Ambulatory Visit | Attending: Gastroenterology | Admitting: Gastroenterology

## 2018-05-22 DIAGNOSIS — Z09 Encounter for follow-up examination after completed treatment for conditions other than malignant neoplasm: Secondary | ICD-10-CM | POA: Diagnosis not present

## 2018-05-22 DIAGNOSIS — K635 Polyp of colon: Secondary | ICD-10-CM | POA: Diagnosis not present

## 2018-05-22 DIAGNOSIS — D123 Benign neoplasm of transverse colon: Secondary | ICD-10-CM | POA: Insufficient documentation

## 2018-05-22 DIAGNOSIS — Z86010 Personal history of colonic polyps: Secondary | ICD-10-CM | POA: Insufficient documentation

## 2018-05-22 HISTORY — DX: Personal history of colonic polyps: Z86.010

## 2018-05-22 HISTORY — DX: Personal history of colon polyps, unspecified: Z86.0100

## 2018-05-22 HISTORY — PX: COLONOSCOPY WITH PROPOFOL: SHX5780

## 2018-05-22 HISTORY — DX: Pure hypercholesterolemia, unspecified: E78.00

## 2018-05-22 SURGERY — COLONOSCOPY WITH PROPOFOL
Anesthesia: General

## 2018-05-22 MED ORDER — FENTANYL CITRATE (PF) 100 MCG/2ML IJ SOLN
INTRAMUSCULAR | Status: DC | PRN
  Administered 2018-05-22: 50 ug via INTRAVENOUS

## 2018-05-22 MED ORDER — MIDAZOLAM HCL 2 MG/2ML IJ SOLN
INTRAMUSCULAR | Status: DC | PRN
  Administered 2018-05-22: 1 mg via INTRAVENOUS

## 2018-05-22 MED ORDER — EPHEDRINE SULFATE 50 MG/ML IJ SOLN
INTRAMUSCULAR | Status: DC | PRN
  Administered 2018-05-22 (×2): 5 mg via INTRAVENOUS

## 2018-05-22 MED ORDER — PROPOFOL 500 MG/50ML IV EMUL
INTRAVENOUS | Status: AC
  Filled 2018-05-22: qty 50

## 2018-05-22 MED ORDER — SODIUM CHLORIDE 0.9 % IV SOLN
INTRAVENOUS | Status: DC
  Administered 2018-05-22: 07:00:00 via INTRAVENOUS

## 2018-05-22 MED ORDER — FENTANYL CITRATE (PF) 100 MCG/2ML IJ SOLN
INTRAMUSCULAR | Status: AC
  Filled 2018-05-22: qty 2

## 2018-05-22 MED ORDER — PROPOFOL 500 MG/50ML IV EMUL
INTRAVENOUS | Status: DC | PRN
  Administered 2018-05-22: 100 ug/kg/min via INTRAVENOUS

## 2018-05-22 MED ORDER — MIDAZOLAM HCL 2 MG/2ML IJ SOLN
INTRAMUSCULAR | Status: AC
  Filled 2018-05-22: qty 2

## 2018-05-22 MED ORDER — EPHEDRINE SULFATE 50 MG/ML IJ SOLN
INTRAMUSCULAR | Status: AC
  Filled 2018-05-22: qty 1

## 2018-05-22 NOTE — Anesthesia Preprocedure Evaluation (Addendum)
Anesthesia Evaluation  Patient identified by MRN, date of birth, ID band Patient awake    Reviewed: Allergy & Precautions, H&P , NPO status , Patient's Chart, lab work & pertinent test results  Airway Mallampati: II  TM Distance: >3 FB     Dental  (+) Teeth Intact   Pulmonary neg pulmonary ROS,           Cardiovascular negative cardio ROS       Neuro/Psych negative neurological ROS  negative psych ROS   GI/Hepatic negative GI ROS, Neg liver ROS,   Endo/Other  negative endocrine ROS  Renal/GU negative Renal ROS  negative genitourinary   Musculoskeletal   Abdominal   Peds  Hematology negative hematology ROS (+)   Anesthesia Other Findings Past Medical History: No date: History of colonic polyps No date: HPV (human papilloma virus) infection No date: Hypercholesterolemia No date: Osteoporosis No date: Postmenopausal No date: Uterine fibroid No date: Uterine fibroid No date: Vitamin D deficiency  Past Surgical History: 1970: cryoablation No date: DILATION AND CURETTAGE OF UTERUS  BMI    Body Mass Index:  15.73 kg/m      Reproductive/Obstetrics negative OB ROS                            Anesthesia Physical Anesthesia Plan  ASA: II  Anesthesia Plan: General   Post-op Pain Management:    Induction:   PONV Risk Score and Plan: Propofol infusion and TIVA  Airway Management Planned: Nasal Cannula and Natural Airway  Additional Equipment:   Intra-op Plan:   Post-operative Plan:   Informed Consent: I have reviewed the patients History and Physical, chart, labs and discussed the procedure including the risks, benefits and alternatives for the proposed anesthesia with the patient or authorized representative who has indicated his/her understanding and acceptance.   Dental Advisory Given  Plan Discussed with: Anesthesiologist  Anesthesia Plan Comments:         Anesthesia Quick Evaluation

## 2018-05-22 NOTE — Transfer of Care (Signed)
Immediate Anesthesia Transfer of Care Note  Patient: Kathleen Powers  Procedure(s) Performed: COLONOSCOPY WITH PROPOFOL (N/A )  Patient Location: PACU  Anesthesia Type:General  Level of Consciousness: sedated  Airway & Oxygen Therapy: Patient Spontanous Breathing and Patient connected to nasal cannula oxygen  Post-op Assessment: Report given to RN and Post -op Vital signs reviewed and stable  Post vital signs: Reviewed and stable  Last Vitals:  Vitals Value Taken Time  BP    Temp    Pulse    Resp    SpO2      Last Pain:  Vitals:   05/22/18 0650  TempSrc: Tympanic         Complications: No apparent anesthesia complications

## 2018-05-22 NOTE — H&P (Signed)
Outpatient short stay form Pre-procedure 05/22/2018 7:21 AM Lollie Sails MD   Primary Physician: Dr. Derinda Late  Reason for visit: Colonoscopy  History of present illness: Patient is a 67 year old female presenting today for a colonoscopy.  She has personal history of colon polyps with her last procedure being done about 3 years ago.  Tolerated prep well.  She takes no aspirin or blood thinning agent. I have discussed the risks benefits and complications of procedures to include not limited to bleeding, infection, perforation and the risk of sedation and the patient wishes to proceed.    Current Facility-Administered Medications:  .  0.9 %  sodium chloride infusion, , Intravenous, Continuous, Lollie Sails, MD, Last Rate: 20 mL/hr at 05/22/18 0705  Medications Prior to Admission  Medication Sig Dispense Refill Last Dose  . milk thistle 175 MG tablet Take 175 mg by mouth daily.   Taking  . Multiple Vitamin (MULTI-VITAMINS) TABS Take by mouth.   Taking     Allergies  Allergen Reactions  . Codeine Nausea And Vomiting     Past Medical History:  Diagnosis Date  . History of colonic polyps   . HPV (human papilloma virus) infection   . Hypercholesterolemia   . Osteoporosis   . Postmenopausal   . Uterine fibroid   . Uterine fibroid   . Vitamin D deficiency     Review of systems:      Physical Exam    Heart and lungs: With and without rub gallop, lungs are bilaterally clear.    HEENT: Normocephalic atraumatic eyes are anicteric    Other:    Pertinant exam for procedure: Soft nontender nondistended bowel sounds positive normoactive. I have discussed the risks benefits and complications of procedures to include not limited to bleeding, infection, perforation and the risk of sedation and the patient wishes to proceed.    Planned proceedures: Colonoscopy and indicated procedures. I have discussed the risks benefits and complications of procedures to include not  limited to bleeding, infection, perforation and the risk of sedation and the patient wishes to proceed.    Lollie Sails, MD Gastroenterology 05/22/2018  7:21 AM

## 2018-05-22 NOTE — Anesthesia Procedure Notes (Signed)
Performed by: Cook-Martin, Jadarious Dobbins Pre-anesthesia Checklist: Patient identified, Emergency Drugs available, Suction available, Patient being monitored and Timeout performed Patient Re-evaluated:Patient Re-evaluated prior to induction Oxygen Delivery Method: Nasal cannula Preoxygenation: Pre-oxygenation with 100% oxygen Induction Type: IV induction Placement Confirmation: positive ETCO2 and CO2 detector       

## 2018-05-22 NOTE — Op Note (Signed)
[facility] Gastroenterology Patient Name: Kathleen Powers Procedure Date: 05/22/2018 7:27 AM MRN: [id number] Account #: [id number] Date of Birth: [birth year] Admit Type: Outpatient Age: 67 Room: [facility] ENDO ROOM 3 Gender: Female Note Status: Finalized Procedure:            Colonoscopy Indications:          Personal history of colonic polyps Providers:            Lollie Sails, MD Referring MD:         Caprice Renshaw MD (Referring MD) Medicines:            Monitored Anesthesia Care Complications:        No immediate complications. Procedure:            Pre-Anesthesia Assessment:                       - ASA Grade Assessment: II - A patient with mild                        systemic disease.                       After obtaining informed consent, the colonoscope was                        passed under direct vision. Throughout the procedure,                        the patient's blood pressure, pulse, and oxygen                        saturations were monitored continuously. The                        Colonoscope was introduced through the anus and                        advanced to the the cecum, identified by appendiceal                        orifice and ileocecal valve. The colonoscopy was                        performed without difficulty. The patient tolerated the                        procedure well. The quality of the bowel preparation                        was good. Findings:      A 5 mm polyp was found in the proximal transverse colon. The polyp was       sessile. The polyp was removed with a piecemeal technique using a cold       biopsy forceps. Resection and retrieval were complete.      Two sessile polyps were found in the distal sigmoid colon. The polyps       were 2 to 5 mm in size. These polyps were removed with a cold snare and       cold forcep. Resection and retrieval were complete.      The exam was  otherwise without abnormality.  The digital rectal exam was normal. Impression:           - One 5 mm polyp in the proximal transverse colon,                        removed piecemeal using a cold biopsy forceps. Resected                        and retrieved.                       - Two 2 to 5 mm polyps in the distal sigmoid colon,                        removed with a cold snare. Resected and retrieved.                       - The examination was otherwise normal. Recommendation:       - Discharge patient to home.                       - Telephone GI clinic for pathology results in 1 week. Lollie Sails, MD 05/22/2018 8:14:52 AM This report has been signed electronically. Number of Addenda: 0 Note Initiated On: 05/22/2018 7:27 AM Scope Withdrawal Time: 0 hours 20 minutes 51 seconds  Total Procedure Duration: 0 hours 31 minutes 16 seconds       [facility]

## 2018-05-22 NOTE — Anesthesia Post-op Follow-up Note (Signed)
Anesthesia QCDR form completed.        

## 2018-05-23 ENCOUNTER — Encounter: Payer: Self-pay | Admitting: Gastroenterology

## 2018-05-23 LAB — SURGICAL PATHOLOGY

## 2018-05-23 NOTE — Anesthesia Postprocedure Evaluation (Signed)
Anesthesia Post Note  Patient: Kathleen Powers  Procedure(s) Performed: COLONOSCOPY WITH PROPOFOL (N/A )  Patient location during evaluation: PACU Anesthesia Type: General Level of consciousness: awake and alert Pain management: pain level controlled Vital Signs Assessment: post-procedure vital signs reviewed and stable Respiratory status: spontaneous breathing, nonlabored ventilation, respiratory function stable and patient connected to nasal cannula oxygen Cardiovascular status: blood pressure returned to baseline and stable Postop Assessment: no apparent nausea or vomiting Anesthetic complications: no     Last Vitals:  Vitals:   05/22/18 0835 05/22/18 0845  BP: (!) 111/58 105/63  Pulse: 62 (!) 58  Resp: 15 13  Temp:    SpO2: 100% 100%    Last Pain:  Vitals:   05/23/18 0725  TempSrc:   PainSc: 0-No pain                 Durenda Hurt

## 2018-10-17 ENCOUNTER — Other Ambulatory Visit: Payer: Self-pay | Admitting: Family Medicine

## 2018-10-17 DIAGNOSIS — Z1231 Encounter for screening mammogram for malignant neoplasm of breast: Secondary | ICD-10-CM

## 2018-11-14 ENCOUNTER — Encounter: Payer: Medicare Other | Admitting: Obstetrics and Gynecology

## 2019-01-08 ENCOUNTER — Other Ambulatory Visit: Payer: Self-pay

## 2019-01-08 ENCOUNTER — Ambulatory Visit
Admission: RE | Admit: 2019-01-08 | Discharge: 2019-01-08 | Disposition: A | Discharge status: Home / Self Care | Payer: Medicare Other | Source: Ambulatory Visit | Attending: Family Medicine | Admitting: Family Medicine

## 2019-01-08 DIAGNOSIS — Z1231 Encounter for screening mammogram for malignant neoplasm of breast: Secondary | ICD-10-CM | POA: Insufficient documentation

## 2019-08-10 ENCOUNTER — Ambulatory Visit: Payer: Medicare PPO | Attending: Internal Medicine

## 2019-08-10 DIAGNOSIS — Z23 Encounter for immunization: Secondary | ICD-10-CM | POA: Insufficient documentation

## 2019-08-10 NOTE — Progress Notes (Signed)
   Covid-19 Vaccination Clinic  Name:  Ranie Knoedler    MRN: [id number] DOB: [birth year]  08/10/2019  Ms. Cobbler was observed post Covid-19 immunization for 15 minutes without incidence. She was provided with Vaccine Information Sheet and instruction to access the V-Safe system.   Ms. Fancy was instructed to call 911 with any severe reactions post vaccine: Marland Kitchen Difficulty breathing  . Swelling of your face and throat  . A fast heartbeat  . A bad rash all over your body  . Dizziness and weakness    Immunizations Administered    Name Date Dose VIS Date Route   Pfizer COVID-19 Vaccine 08/10/2019 11:44 AM 0.3 mL 06/08/2019 Intramuscular   Manufacturer: [place]   Lot: [id number]   [place]: [id number]

## 2019-09-02 ENCOUNTER — Ambulatory Visit: Payer: Medicare PPO

## 2019-09-04 ENCOUNTER — Ambulatory Visit: Payer: Medicare PPO | Attending: Internal Medicine

## 2019-09-04 DIAGNOSIS — Z23 Encounter for immunization: Secondary | ICD-10-CM | POA: Insufficient documentation

## 2019-09-04 NOTE — Progress Notes (Signed)
   Covid-19 Vaccination Clinic  Name:  Kathleen Powers    MRN: [id number] DOB: Jan 04, 1951  09/04/2019  [name] was observed post Covid-19 immunization for 15 minutes without incident. She was provided with Vaccine Information Sheet and instruction to access the V-Safe system.   [name] was instructed to call 911 with any severe reactions post vaccine: Marland Kitchen Difficulty breathing  . Swelling of face and throat  . A fast heartbeat  . A bad rash all over body  . Dizziness and weakness   Immunizations Administered    Name Date Dose VIS Date Route   Pfizer COVID-19 Vaccine 09/04/2019 12:23 PM 0.3 mL 06/08/2019 Intramuscular   Manufacturer: [place]   Lot: [id number]   [place]: [id number]

## 2019-12-11 ENCOUNTER — Other Ambulatory Visit: Payer: Self-pay | Admitting: Family Medicine

## 2019-12-11 DIAGNOSIS — Z1231 Encounter for screening mammogram for malignant neoplasm of breast: Secondary | ICD-10-CM

## 2020-01-15 ENCOUNTER — Ambulatory Visit
Admission: RE | Admit: 2020-01-15 | Discharge: 2020-01-15 | Disposition: A | Discharge status: Home / Self Care | Payer: Medicare PPO | Source: Ambulatory Visit | Attending: Family Medicine | Admitting: Family Medicine

## 2020-01-15 DIAGNOSIS — Z1231 Encounter for screening mammogram for malignant neoplasm of breast: Secondary | ICD-10-CM | POA: Diagnosis present

## 2020-01-21 ENCOUNTER — Other Ambulatory Visit: Payer: Self-pay | Admitting: Family Medicine

## 2020-01-21 DIAGNOSIS — N6489 Other specified disorders of breast: Secondary | ICD-10-CM

## 2020-01-21 DIAGNOSIS — R928 Other abnormal and inconclusive findings on diagnostic imaging of breast: Secondary | ICD-10-CM

## 2020-01-23 ENCOUNTER — Ambulatory Visit
Admission: RE | Admit: 2020-01-23 | Discharge: 2020-01-23 | Disposition: A | Discharge status: Home / Self Care | Payer: Medicare PPO | Source: Ambulatory Visit | Attending: Family Medicine | Admitting: Family Medicine

## 2020-01-23 DIAGNOSIS — N6489 Other specified disorders of breast: Secondary | ICD-10-CM

## 2020-01-23 DIAGNOSIS — R928 Other abnormal and inconclusive findings on diagnostic imaging of breast: Secondary | ICD-10-CM | POA: Insufficient documentation

## 2020-05-13 ENCOUNTER — Other Ambulatory Visit: Payer: Self-pay | Admitting: Internal Medicine

## 2020-05-13 ENCOUNTER — Ambulatory Visit: Payer: Medicare PPO | Attending: Internal Medicine

## 2020-05-13 DIAGNOSIS — Z23 Encounter for immunization: Secondary | ICD-10-CM

## 2020-05-13 NOTE — Progress Notes (Signed)
   Covid-19 Vaccination Clinic  Name:  Kathleen Powers    MRN: [id number] DOB: 10-10-1950  05/13/2020  [name] was observed post Covid-19 immunization for 15 minutes without incident. She was provided with Vaccine Information Sheet and instruction to access the V-Safe system.   [name] was instructed to call 911 with any severe reactions post vaccine: Marland Kitchen Difficulty breathing  . Swelling of face and throat  . A fast heartbeat  . A bad rash all over body  . Dizziness and weakness   Immunizations Administered    Name Date Dose VIS Date Route   Pfizer COVID-19 Vaccine 05/13/2020 11:48 AM 0.3 mL 04/16/2020 Intramuscular   Manufacturer: [place]   Lot: [id number]   [place]: [id number]

## 2020-12-19 ENCOUNTER — Other Ambulatory Visit: Payer: Self-pay | Admitting: Family Medicine

## 2020-12-19 DIAGNOSIS — Z1231 Encounter for screening mammogram for malignant neoplasm of breast: Secondary | ICD-10-CM

## 2021-01-15 ENCOUNTER — Other Ambulatory Visit: Payer: Self-pay

## 2021-01-15 ENCOUNTER — Ambulatory Visit
Admission: RE | Admit: 2021-01-15 | Discharge: 2021-01-15 | Disposition: A | Discharge status: Home / Self Care | Payer: Medicare PPO | Source: Ambulatory Visit | Attending: Family Medicine | Admitting: Family Medicine

## 2021-01-15 DIAGNOSIS — Z1231 Encounter for screening mammogram for malignant neoplasm of breast: Secondary | ICD-10-CM | POA: Diagnosis not present

## 2021-05-04 ENCOUNTER — Other Ambulatory Visit: Payer: Self-pay

## 2021-05-04 ENCOUNTER — Ambulatory Visit: Payer: Medicare PPO | Admitting: Dermatology

## 2021-05-04 DIAGNOSIS — Z86018 Personal history of other benign neoplasm: Secondary | ICD-10-CM

## 2021-05-04 DIAGNOSIS — L821 Other seborrheic keratosis: Secondary | ICD-10-CM

## 2021-05-04 DIAGNOSIS — Z85828 Personal history of other malignant neoplasm of skin: Secondary | ICD-10-CM

## 2021-05-04 DIAGNOSIS — Z1283 Encounter for screening for malignant neoplasm of skin: Secondary | ICD-10-CM

## 2021-05-04 DIAGNOSIS — L814 Other melanin hyperpigmentation: Secondary | ICD-10-CM

## 2021-05-04 DIAGNOSIS — D1800 Hemangioma unspecified site: Secondary | ICD-10-CM

## 2021-05-04 DIAGNOSIS — D229 Melanocytic nevi, unspecified: Secondary | ICD-10-CM | POA: Diagnosis not present

## 2021-05-04 DIAGNOSIS — L578 Other skin changes due to chronic exposure to nonionizing radiation: Secondary | ICD-10-CM | POA: Diagnosis not present

## 2021-05-04 NOTE — Progress Notes (Signed)
   Follow-Up Visit   Subjective  Kathleen Powers is a 70 y.o. female who presents for the following: Annual Exam (Patient here for full body skin exam and skin cancer screening. Patient with hx of dysplastic nevi and SCC. She has noticed 2 spots at left face, new since last year but not bothersome for patient.).  The following portions of the chart were reviewed this encounter and updated as appropriate:   Tobacco  Allergies  Meds  Problems  Med Hx  Surg Hx  Fam Hx     Review of Systems:  No other skin or systemic complaints except as noted in HPI or Assessment and Plan.  Objective  Well appearing patient in no apparent distress; mood and affect are within normal limits.  A full examination was performed including scalp, head, eyes, ears, nose, lips, neck, chest, axillae, abdomen, back, buttocks, bilateral upper extremities, bilateral lower extremities, hands, feet, fingers, toes, fingernails, and toenails. All findings within normal limits unless otherwise noted below.   Assessment & Plan  Skin cancer screening  Lentigines - Scattered tan macules - Due to sun exposure - Benign-appearing, observe - Recommend daily broad spectrum sunscreen SPF 30+ to sun-exposed areas, reapply every 2 hours as needed. - Call for any changes  Seborrheic Keratoses - Stuck-on, waxy, tan-brown papules and/or plaques  - Benign-appearing - Discussed benign etiology and prognosis. - Observe - Call for any changes  Melanocytic Nevi - Tan-brown and/or pink-flesh-colored symmetric macules and papules - Benign appearing on exam today - Observation - Call clinic for new or changing moles - Recommend daily use of broad spectrum spf 30+ sunscreen to sun-exposed areas.   Hemangiomas - Red papules - Discussed benign nature - Observe - Call for any changes  Actinic Damage - Chronic condition, secondary to cumulative UV/sun exposure - diffuse scaly erythematous macules with underlying  dyspigmentation - Recommend daily broad spectrum sunscreen SPF 30+ to sun-exposed areas, reapply every 2 hours as needed.  - Staying in the shade or wearing long sleeves, sun glasses (UVA+UVB protection) and wide brim hats (4-inch brim around the entire circumference of the hat) are also recommended for sun protection.  - Call for new or changing lesions.  Skin cancer screening performed today.  History of Dysplastic Nevi - No evidence of recurrence today - Recommend regular full body skin exams - Recommend daily broad spectrum sunscreen SPF 30+ to sun-exposed areas, reapply every 2 hours as needed.  - Call if any new or changing lesions are noted between office visits  History of Squamous Cell Carcinoma of the Skin - No evidence of recurrence today - No lymphadenopathy - Recommend regular full body skin exams - Recommend daily broad spectrum sunscreen SPF 30+ to sun-exposed areas, reapply every 2 hours as needed.  - Call if any new or changing lesions are noted between office visits  Return in about 1 year (around 05/04/2022) for TBSE.  Graciella Belton, RMA, am acting as scribe for Sarina Ser, MD . Documentation: I have reviewed the above documentation for accuracy and completeness, and I agree with the above.  Sarina Ser, MD

## 2021-05-04 NOTE — Patient Instructions (Signed)

## 2021-05-05 ENCOUNTER — Encounter: Payer: Self-pay | Admitting: Dermatology

## 2021-09-20 IMAGING — MG DIGITAL SCREENING BILAT W/ TOMO W/ CAD
6 of 10 series · 6 of 30 positions shown · non-contrast
Comparison: Previous exams.

CLINICAL DATA: Screening.

EXAM:
DIGITAL SCREENING BILATERAL MAMMOGRAM WITH TOMO AND CAD

[R MLO synth-2D (1 of 2)]
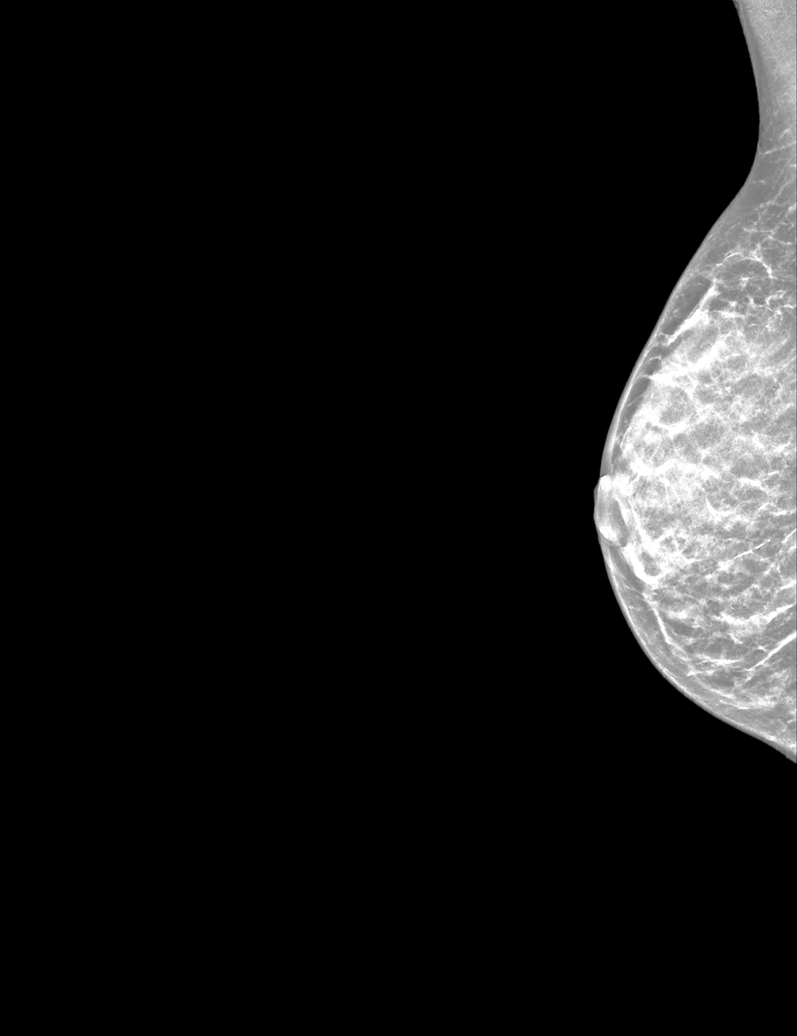

[L CC synth-2D]
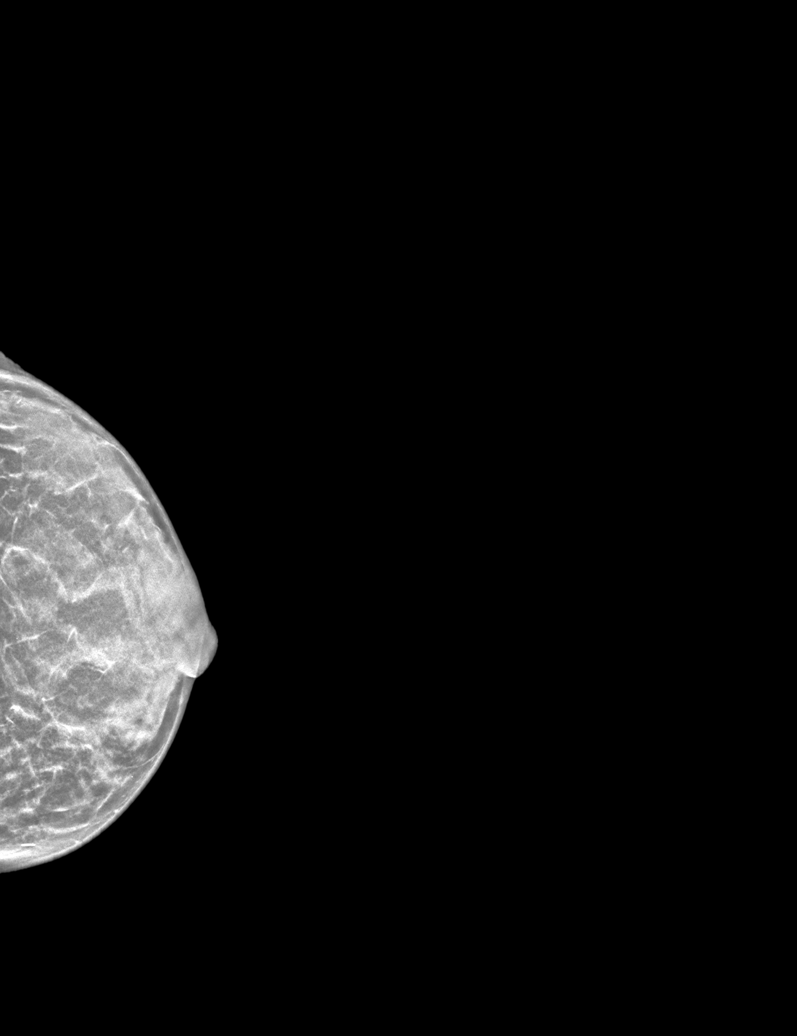

[L MLO synth-2D]
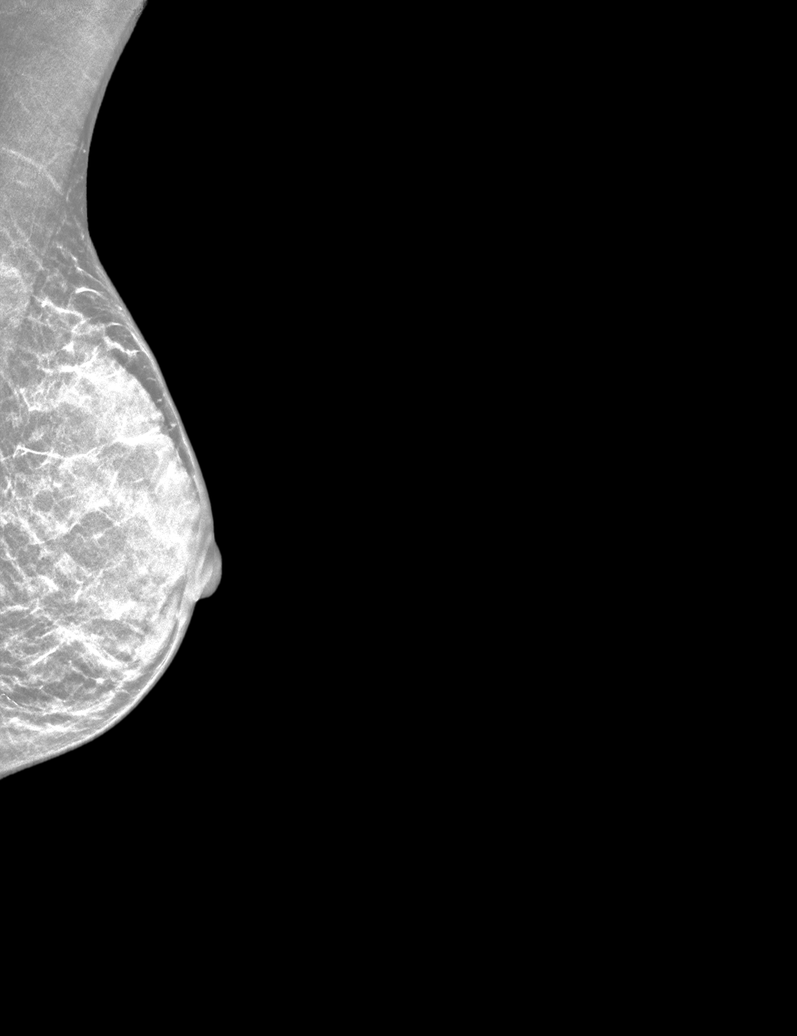

[R MLO synth-2D (2 of 2)]
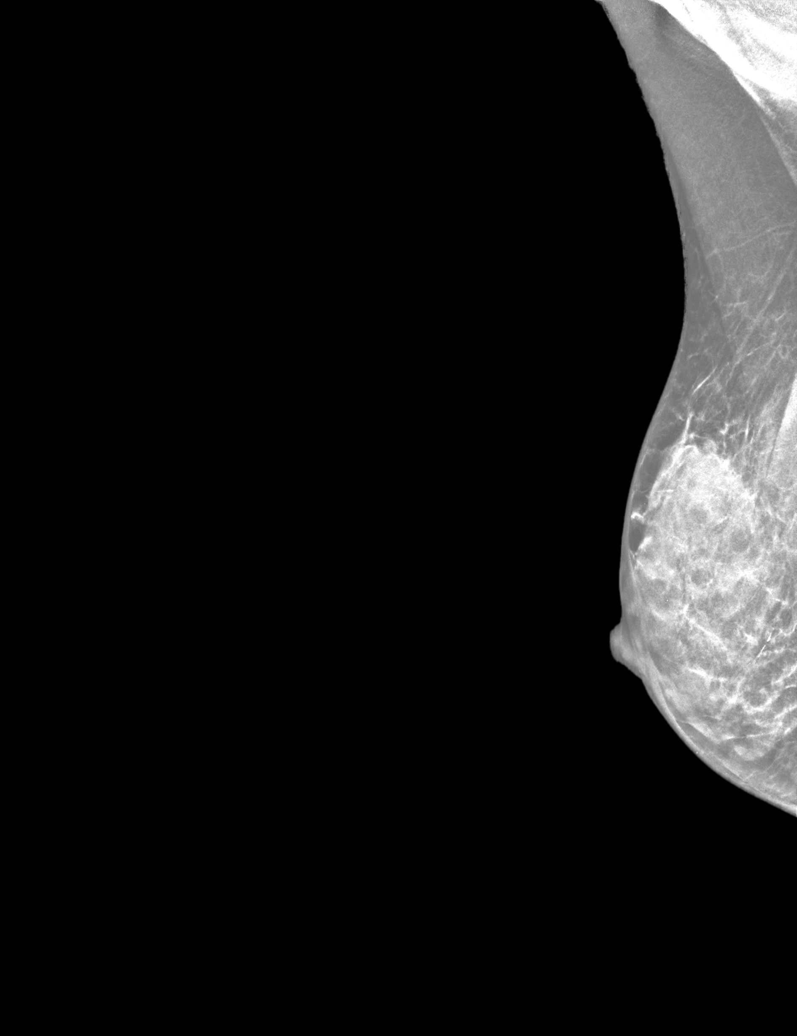

[R CC synth-2D]
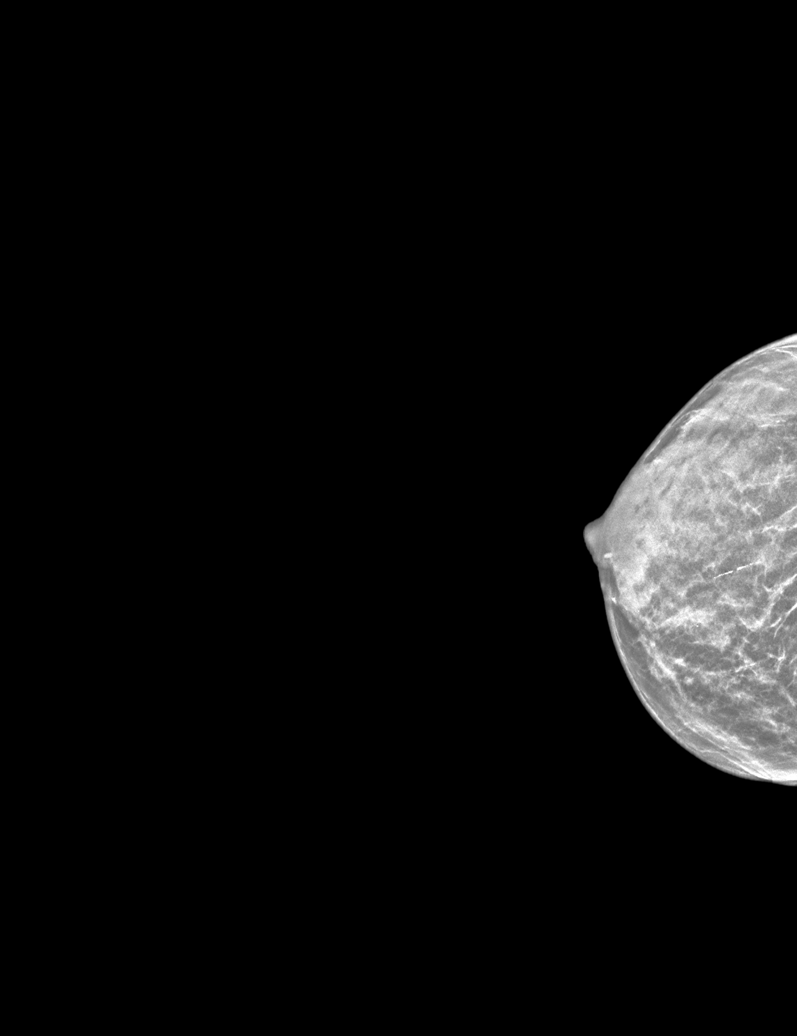

[L CC tomo · tomo slice 15/29.0]
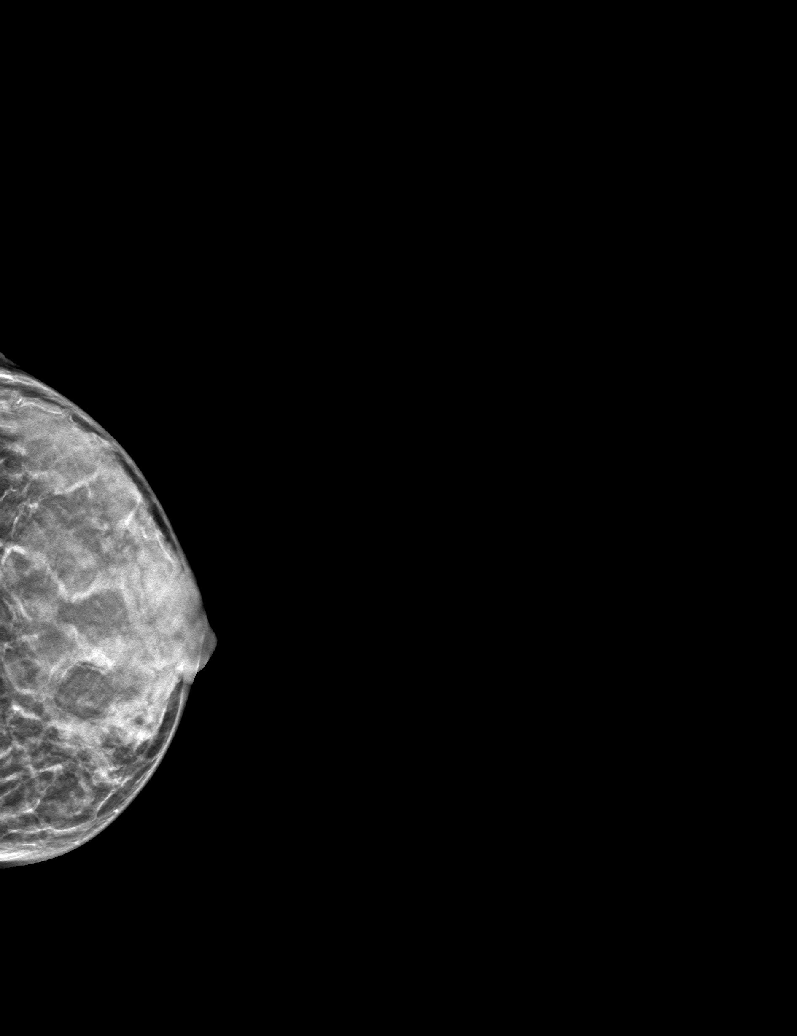

[6 of 30 positions shown; findings below may reference images not displayed]

ACR Breast Density Category d: The breast tissue is extremely dense,
which lowers the sensitivity of mammography.
FINDINGS: In the left breast, a possible asymmetry warrants further
evaluation. In the right breast, no findings suspicious for
malignancy. Images were processed with CAD.
IMPRESSION: Further evaluation is suggested for possible asymmetry in the left
breast.

RECOMMENDATION:
Diagnostic mammogram and possibly ultrasound of the left breast.
(Code:[id number])

The patient will be contacted regarding the findings, and additional
imaging will be scheduled.

BI-RADS CATEGORY  0: Incomplete. Need additional imaging evaluation
and/or prior mammograms for comparison.

## 2021-12-14 ENCOUNTER — Other Ambulatory Visit: Payer: Self-pay | Admitting: Family Medicine

## 2021-12-14 DIAGNOSIS — Z1231 Encounter for screening mammogram for malignant neoplasm of breast: Secondary | ICD-10-CM

## 2022-05-05 ENCOUNTER — Ambulatory Visit: Payer: Medicare PPO | Admitting: Dermatology

## 2022-05-05 DIAGNOSIS — Z1283 Encounter for screening for malignant neoplasm of skin: Secondary | ICD-10-CM

## 2022-05-05 DIAGNOSIS — L578 Other skin changes due to chronic exposure to nonionizing radiation: Secondary | ICD-10-CM

## 2022-05-05 DIAGNOSIS — L821 Other seborrheic keratosis: Secondary | ICD-10-CM

## 2022-05-05 DIAGNOSIS — D229 Melanocytic nevi, unspecified: Secondary | ICD-10-CM

## 2022-05-05 DIAGNOSIS — L814 Other melanin hyperpigmentation: Secondary | ICD-10-CM

## 2022-05-05 DIAGNOSIS — Z85828 Personal history of other malignant neoplasm of skin: Secondary | ICD-10-CM

## 2022-05-05 DIAGNOSIS — Z86018 Personal history of other benign neoplasm: Secondary | ICD-10-CM

## 2022-05-05 DIAGNOSIS — Z8589 Personal history of malignant neoplasm of other organs and systems: Secondary | ICD-10-CM

## 2022-05-05 NOTE — Patient Instructions (Signed)
Due to recent changes in healthcare laws, you may see results of your pathology and/or laboratory studies on MyChart before the doctors have had a chance to review them. We understand that in some cases there may be results that are confusing or concerning to you. Please understand that not all results are received at the same time and often the doctors may need to interpret multiple results in order to provide you with the best plan of care or course of treatment. Therefore, we ask that you please give us 2 business days to thoroughly review all your results before contacting the office for clarification. Should we see a critical lab result, you will be contacted sooner.   If You Need Anything After Your Visit  If you have any questions or concerns for your doctor, please call our main line at [phone] and press option 4 to reach your doctor's medical assistant. If no one answers, please leave a voicemail as directed and we will return your call as soon as possible. Messages left after 4 pm will be answered the following business day.   You may also send us a message via MyChart. We typically respond to MyChart messages within 1-2 business days.  For prescription refills, please ask your pharmacy to contact our office. Our fax number is [phone].  If you have an urgent issue when the clinic is closed that cannot wait until the next business day, you can page your doctor at the number below.    Please note that while we do our best to be available for urgent issues outside of office hours, we are not available 24/7.   If you have an urgent issue and are unable to reach us, you may choose to seek medical care at your doctor's office, retail clinic, urgent care center, or emergency room.  If you have a medical emergency, please immediately call 911 or go to the emergency department.  Pager Numbers  - Dr. Kowalski: [phone]  - Dr. Moye: [phone]  - Dr. Stewart:  [phone]  In the event of inclement weather, please call our main line at [phone] for an update on the status of any delays or closures.  Dermatology Medication Tips: Please keep the boxes that topical medications come in in order to help keep track of the instructions about where and how to use these. Pharmacies typically print the medication instructions only on the boxes and not directly on the medication tubes.   If your medication is too expensive, please contact our office at [phone] option 4 or send us a message through MyChart.   We are unable to tell what your co-pay for medications will be in advance as this is different depending on your insurance coverage. However, we may be able to find a substitute medication at lower cost or fill out paperwork to get insurance to cover a needed medication.   If a prior authorization is required to get your medication covered by your insurance company, please allow us 1-2 business days to complete this process.  Drug prices often vary depending on where the prescription is filled and some pharmacies may offer cheaper prices.  The website www.goodrx.com contains coupons for medications through different pharmacies. The prices here do not account for what the cost may be with help from insurance (it may be cheaper with your insurance), but the website can give you the price if you did not use any insurance.  - You can print the associated coupon and take it with   your prescription to the pharmacy.  - You may also stop by our office during regular business hours and pick up a GoodRx coupon card.  - If you need your prescription sent electronically to a different pharmacy, notify our office through [place] MyChart or by phone at [phone] option 4.     Si Usted Necesita Algo Despus de Su Visita  Tambin puede enviarnos un mensaje a travs de MyChart. Por lo general respondemos a los mensajes de MyChart en el transcurso de 1 a 2  das hbiles.  Para renovar recetas, por favor pida a su farmacia que se ponga en contacto con nuestra oficina. Nuestro nmero de fax es el [phone].  Si tiene un asunto urgente cuando la clnica est cerrada y que no puede esperar hasta el siguiente da hbil, puede llamar/localizar a su doctor(a) al nmero que aparece a continuacin.   Por favor, tenga en cuenta que aunque hacemos todo lo posible para estar disponibles para asuntos urgentes fuera del horario de oficina, no estamos disponibles las 24 horas del da, los 7 das de la semana.   Si tiene un problema urgente y no puede comunicarse con nosotros, puede optar por buscar atencin mdica  en el consultorio de su doctor(a), en una clnica privada, en un centro de atencin urgente o en una sala de emergencias.  Si tiene una emergencia mdica, por favor llame inmediatamente al 911 o vaya a la sala de emergencias.  Nmeros de bper  - Dr. Kowalski: [phone]  - Dra. Moye: [phone]  - Dra. Stewart: [phone]  En caso de inclemencias del tiempo, por favor llame a nuestra lnea principal al [phone] para una actualizacin sobre el estado de cualquier retraso o cierre.  Consejos para la medicacin en dermatologa: Por favor, guarde las cajas en las que vienen los medicamentos de uso tpico para ayudarle a seguir las instrucciones sobre dnde y cmo usarlos. Las farmacias generalmente imprimen las instrucciones del medicamento slo en las cajas y no directamente en los tubos del medicamento.   Si su medicamento es muy caro, por favor, pngase en contacto con nuestra oficina llamando al [phone] y presione la opcin 4 o envenos un mensaje a travs de MyChart.   No podemos decirle cul ser su copago por los medicamentos por adelantado ya que esto es diferente dependiendo de la cobertura de su seguro. Sin embargo, es posible que podamos encontrar un medicamento sustituto a menor costo o llenar un formulario para que el  seguro cubra el medicamento que se considera necesario.   Si se requiere una autorizacin previa para que su compaa de seguros cubra su medicamento, por favor permtanos de 1 a 2 das hbiles para completar este proceso.  Los precios de los medicamentos varan con frecuencia dependiendo del lugar de dnde se surte la receta y alguna farmacias pueden ofrecer precios ms baratos.  El sitio web www.goodrx.com tiene cupones para medicamentos de diferentes farmacias. Los precios aqu no tienen en cuenta lo que podra costar con la ayuda del seguro (puede ser ms barato con su seguro), pero el sitio web puede darle el precio si no utiliz ningn seguro.  - Puede imprimir el cupn correspondiente y llevarlo con su receta a la farmacia.  - Tambin puede pasar por nuestra oficina durante el horario de atencin regular y recoger una tarjeta de cupones de GoodRx.  - Si necesita que su receta se enve electrnicamente a una farmacia diferente, informe a nuestra oficina a travs de MyChart de [place]   o por telfono llamando al [phone] y presione la opcin 4.  

## 2022-05-05 NOTE — Progress Notes (Signed)
   Follow-Up Visit   Subjective  Kathleen Powers is a 71 y.o. female who presents for the following: Annual Exam (Mole check ). The patient presents for Total-Body Skin Exam (TBSE) for skin cancer screening and mole check.  The patient has spots, moles and lesions to be evaluated, some may be new or changing and the patient has concerns that these could be cancer.   The following portions of the chart were reviewed this encounter and updated as appropriate:   Tobacco  Allergies  Meds  Problems  Med Hx  Surg Hx  Fam Hx     Review of Systems:  No other skin or systemic complaints except as noted in HPI or Assessment and Plan.  Objective  Well appearing patient in no apparent distress; mood and affect are within normal limits.  A full examination was performed including scalp, head, eyes, ears, nose, lips, neck, chest, axillae, abdomen, back, buttocks, bilateral upper extremities, bilateral lower extremities, hands, feet, fingers, toes, fingernails, and toenails. All findings within normal limits unless otherwise noted below.  chest,back Stuck-on, waxy, tan-brown papule or plaque --Discussed benign etiology and prognosis.    Assessment & Plan  Seborrheic keratosis chest,back  Reassured benign age-related growth.  Recommend observation.  Discussed cryotherapy if spot(s) become irritated or inflamed.   Lentigines - Scattered tan macules - Due to sun exposure - Benign-appearing, observe - Recommend daily broad spectrum sunscreen SPF 30+ to sun-exposed areas, reapply every 2 hours as needed. - Call for any changes   Melanocytic Nevi - Tan-brown and/or pink-flesh-colored symmetric macules and papules - Benign appearing on exam today - Observation - Call clinic for new or changing moles - Recommend daily use of broad spectrum spf 30+ sunscreen to sun-exposed areas.   Hemangiomas - Red papules - Discussed benign nature - Observe - Call for any changes  Actinic  Damage - Chronic condition, secondary to cumulative UV/sun exposure - diffuse scaly erythematous macules with underlying dyspigmentation - Recommend daily broad spectrum sunscreen SPF 30+ to sun-exposed areas, reapply every 2 hours as needed.  - Staying in the shade or wearing long sleeves, sun glasses (UVA+UVB protection) and wide brim hats (4-inch brim around the entire circumference of the hat) are also recommended for sun protection.  - Call for new or changing lesions.  History of Dysplastic Nevi Multiple see history - No evidence of recurrence today - Recommend regular full body skin exams - Recommend daily broad spectrum sunscreen SPF 30+ to sun-exposed areas, reapply every 2 hours as needed.  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  - Call if any new or changing lesions are noted between office visits   Skin cancer screening performed today.   Return in about 1 year (around 05/06/2023) for TBSE, hx of SCC, hx of Dysplastic nevus . IMarye Round, CMA, am acting as scribe for Sarina Ser, MD .  Documentation: I have reviewed the above documentation for accuracy and completeness, and I agree with the above.  Sarina Ser, MD

## 2022-05-14 ENCOUNTER — Encounter: Payer: Self-pay | Admitting: Dermatology

## 2022-05-19 ENCOUNTER — Ambulatory Visit
Admission: RE | Admit: 2022-05-19 | Discharge: 2022-05-19 | Disposition: A | Discharge status: Home / Self Care | Payer: Medicare PPO | Source: Ambulatory Visit | Attending: Family Medicine | Admitting: Family Medicine

## 2022-05-19 DIAGNOSIS — Z1231 Encounter for screening mammogram for malignant neoplasm of breast: Secondary | ICD-10-CM | POA: Insufficient documentation

## 2023-04-05 ENCOUNTER — Other Ambulatory Visit: Payer: Self-pay | Admitting: Family Medicine

## 2023-04-05 DIAGNOSIS — Z1231 Encounter for screening mammogram for malignant neoplasm of breast: Secondary | ICD-10-CM

## 2023-05-11 ENCOUNTER — Ambulatory Visit: Payer: Medicare PPO | Admitting: Dermatology

## 2023-05-11 ENCOUNTER — Encounter: Payer: Self-pay | Admitting: Dermatology

## 2023-05-11 DIAGNOSIS — Z1283 Encounter for screening for malignant neoplasm of skin: Secondary | ICD-10-CM

## 2023-05-11 DIAGNOSIS — D229 Melanocytic nevi, unspecified: Secondary | ICD-10-CM

## 2023-05-11 DIAGNOSIS — L578 Other skin changes due to chronic exposure to nonionizing radiation: Secondary | ICD-10-CM

## 2023-05-11 DIAGNOSIS — Z86018 Personal history of other benign neoplasm: Secondary | ICD-10-CM

## 2023-05-11 DIAGNOSIS — L814 Other melanin hyperpigmentation: Secondary | ICD-10-CM | POA: Diagnosis not present

## 2023-05-11 DIAGNOSIS — Z85828 Personal history of other malignant neoplasm of skin: Secondary | ICD-10-CM

## 2023-05-11 DIAGNOSIS — Z8589 Personal history of malignant neoplasm of other organs and systems: Secondary | ICD-10-CM

## 2023-05-11 NOTE — Patient Instructions (Signed)

## 2023-05-11 NOTE — Progress Notes (Signed)
   Follow-Up Visit   Subjective  Kathleen Powers is a 72 y.o. female who presents for the following: Skin Cancer Screening and Full Body Skin Exam  The patient presents for Total-Body Skin Exam (TBSE) for skin cancer screening and mole check. The patient has spots, moles and lesions to be evaluated, some may be new or changing and the patient may have concern these could be cancer.    The following portions of the chart were reviewed this encounter and updated as appropriate: medications, allergies, medical history  Review of Systems:  No other skin or systemic complaints except as noted in HPI or Assessment and Plan.  Objective  Well appearing patient in no apparent distress; mood and affect are within normal limits.  A full examination was performed including scalp, head, eyes, ears, nose, lips, neck, chest, axillae, abdomen, back, buttocks, bilateral upper extremities, bilateral lower extremities, hands, feet, fingers, toes, fingernails, and toenails. All findings within normal limits unless otherwise noted below.   Relevant physical exam findings are noted in the Assessment and Plan.    Assessment & Plan   SKIN CANCER SCREENING PERFORMED TODAY.  ACTINIC DAMAGE - Chronic condition, secondary to cumulative UV/sun exposure - diffuse scaly erythematous macules with underlying dyspigmentation - Recommend daily broad spectrum sunscreen SPF 30+ to sun-exposed areas, reapply every 2 hours as needed.  - Staying in the shade or wearing long sleeves, sun glasses (UVA+UVB protection) and wide brim hats (4-inch brim around the entire circumference of the hat) are also recommended for sun protection.  - Call for new or changing lesions.  LENTIGINES, SEBORRHEIC KERATOSES, HEMANGIOMAS - Benign normal skin lesions - Benign-appearing - Call for any changes  MELANOCYTIC NEVI - Tan-brown and/or pink-flesh-colored symmetric macules and papules - Benign appearing on exam today -  Observation - Call clinic for new or changing moles - Recommend daily use of broad spectrum spf 30+ sunscreen to sun-exposed areas.   HISTORY OF DYSPLASTIC NEVUS No evidence of recurrence today Recommend regular full body skin exams Recommend daily broad spectrum sunscreen SPF 30+ to sun-exposed areas, reapply every 2 hours as needed.  Call if any new or changing lesions are noted between office visits  HISTORY OF SQUAMOUS CELL CARCINOMA OF THE SKIN - No evidence of recurrence today - No lymphadenopathy - Recommend regular full body skin exams - Recommend daily broad spectrum sunscreen SPF 30+ to sun-exposed areas, reapply every 2 hours as needed.  - Call if any new or changing lesions are noted between office visits  Return for TBSE in 1-2 years.  Maylene Roes, CMA, am acting as scribe for Armida Sans, MD .   Documentation: I have reviewed the above documentation for accuracy and completeness, and I agree with the above.  Armida Sans, MD

## 2023-05-18 ENCOUNTER — Encounter: Payer: Self-pay | Admitting: Dermatology

## 2023-05-23 ENCOUNTER — Ambulatory Visit
Admission: RE | Admit: 2023-05-23 | Discharge: 2023-05-23 | Disposition: A | Discharge status: Home / Self Care | Payer: Medicare PPO | Source: Ambulatory Visit | Attending: Family Medicine | Admitting: Family Medicine

## 2023-05-23 DIAGNOSIS — Z1231 Encounter for screening mammogram for malignant neoplasm of breast: Secondary | ICD-10-CM | POA: Diagnosis present

## 2024-01-23 ENCOUNTER — Ambulatory Visit: Admitting: Dermatology

## 2024-01-23 ENCOUNTER — Encounter: Payer: Self-pay | Admitting: Dermatology

## 2024-01-23 DIAGNOSIS — L820 Inflamed seborrheic keratosis: Secondary | ICD-10-CM

## 2024-01-23 DIAGNOSIS — L578 Other skin changes due to chronic exposure to nonionizing radiation: Secondary | ICD-10-CM

## 2024-01-23 DIAGNOSIS — Z7189 Other specified counseling: Secondary | ICD-10-CM

## 2024-01-23 DIAGNOSIS — H01134 Eczematous dermatitis of left upper eyelid: Secondary | ICD-10-CM | POA: Diagnosis not present

## 2024-01-23 DIAGNOSIS — Z86018 Personal history of other benign neoplasm: Secondary | ICD-10-CM

## 2024-01-23 DIAGNOSIS — Z79899 Other long term (current) drug therapy: Secondary | ICD-10-CM | POA: Diagnosis not present

## 2024-01-23 DIAGNOSIS — Z8589 Personal history of malignant neoplasm of other organs and systems: Secondary | ICD-10-CM

## 2024-01-23 MED ORDER — PIMECROLIMUS 1 % EX CREA: TOPICAL_CREAM | CUTANEOUS | 2 refills | Status: AC

## 2024-01-23 NOTE — Patient Instructions (Addendum)
 Start Pimecrolimus  cream twice a day as needed for rash on eyelids. If too expensive or not covered use OTC Hydrocortisone 0.1% cream twice a day as needed for rash/irritation.     Cryotherapy Aftercare  Wash gently with soap and water everyday.   Apply Vaseline Jelly daily until healed.    Recommend daily broad spectrum sunscreen SPF 30+ to sun-exposed areas, reapply every 2 hours as needed. Call for new or changing lesions.  Staying in the shade or wearing long sleeves, sun glasses (UVA+UVB protection) and wide brim hats (4-inch brim around the entire circumference of the hat) are also recommended for sun protection.      Due to recent changes in healthcare laws, you may see results of your pathology and/or laboratory studies on MyChart before the doctors have had a chance to review them. We understand that in some cases there may be results that are confusing or concerning to you. Please understand that not all results are received at the same time and often the doctors may need to interpret multiple results in order to provide you with the best plan of care or course of treatment. Therefore, we ask that you please give us  2 business days to thoroughly review all your results before contacting the office for clarification. Should we see a critical lab result, you will be contacted sooner.   If You Need Anything After Your Visit  If you have any questions or concerns for your doctor, please call our main line at [phone] and press option 4 to reach your doctor's medical assistant. If no one answers, please leave a voicemail as directed and we will return your call as soon as possible. Messages left after 4 pm will be answered the following business day.   You may also send us  a message via MyChart. We typically respond to MyChart messages within 1-2 business days.  For prescription refills, please ask your pharmacy to contact our office. Our fax number is [phone].  If you have  an urgent issue when the clinic is closed that cannot wait until the next business day, you can page your doctor at the number below.    Please note that while we do our best to be available for urgent issues outside of office hours, we are not available 24/7.   If you have an urgent issue and are unable to reach us , you may choose to seek medical care at your doctor's office, retail clinic, urgent care center, or emergency room.  If you have a medical emergency, please immediately call 911 or go to the emergency department.  Pager Numbers  - Dr. Hester: [phone]  - Dr. Jackquline: [phone]  - Dr. Claudene: [phone]   In the event of inclement weather, please call our main line at [phone] for an update on the status of any delays or closures.  Dermatology Medication Tips: Please keep the boxes that topical medications come in in order to help keep track of the instructions about where and how to use these. Pharmacies typically print the medication instructions only on the boxes and not directly on the medication tubes.   If your medication is too expensive, please contact our office at [phone] option 4 or send us  a message through MyChart.   We are unable to tell what your co-pay for medications will be in advance as this is different depending on your insurance coverage. However, we may be able to find a substitute medication at lower cost or fill out paperwork  to get insurance to cover a needed medication.   If a prior authorization is required to get your medication covered by your insurance company, please allow us  1-2 business days to complete this process.  Drug prices often vary depending on where the prescription is filled and some pharmacies may offer cheaper prices.  The website www.goodrx.com contains coupons for medications through different pharmacies. The prices here do not account for what the cost may be with help from insurance (it may be cheaper with  your insurance), but the website can give you the price if you did not use any insurance.  - You can print the associated coupon and take it with your prescription to the pharmacy.  - You may also stop by our office during regular business hours and pick up a GoodRx coupon card.  - If you need your prescription sent electronically to a different pharmacy, notify our office through [facility] or by phone at [phone] option 4.     Si Usted Necesita Algo Despus de Su Visita  Tambin puede enviarnos un mensaje a travs de Clinical cytogeneticist. Por lo general respondemos a los mensajes de MyChart en el transcurso de 1 a 2 das hbiles.  Para renovar recetas, por favor pida a su farmacia que se ponga en contacto con nuestra oficina. Randi lakes de fax es [place] [phone].  Si tiene un asunto urgente cuando la clnica est cerrada y que no puede esperar hasta el siguiente da hbil, puede llamar/localizar a su doctor(a) al nmero que aparece a continuacin.   Por favor, tenga en cuenta que aunque hacemos todo lo posible para estar disponibles para asuntos urgentes fuera del horario de [place], no estamos disponibles las 24 horas del da, los 7 [address]  [address] de la [place].   Si tiene un problema urgente y no puede comunicarse con nosotros, puede optar por buscar atencin mdica  en el consultorio de su doctor(a), en una clnica privada, en un centro de atencin urgente o en una sala de emergencias.  Si tiene Engineer, drilling, por favor llame inmediatamente al 911 o vaya a la sala de emergencias.  Nmeros de bper  - Dr. Hester: [phone]  - Dra. Jackquline: [phone]  - Dr. Claudene: [phone]   En caso de inclemencias del tiempo, por favor llame a landry capes principal al [phone] para una actualizacin sobre el [place] de cualquier retraso o cierre.  Consejos para la medicacin en dermatologa: Por favor, guarde las cajas en las que vienen los medicamentos de uso tpico para  ayudarle a seguir las instrucciones sobre dnde y cmo usarlos. Las farmacias generalmente imprimen las instrucciones del medicamento slo en las cajas y no directamente en los tubos del [place].   Si su medicamento es muy caro, por favor, pngase en contacto con landry rieger llamando al [phone] y presione la opcin 4 o envenos un mensaje a travs de Clinical cytogeneticist.   No podemos decirle cul ser su copago por los medicamentos por adelantado ya que esto es diferente dependiendo de la cobertura de su seguro. Sin embargo, es posible que podamos encontrar un medicamento sustituto a Audiological scientist un formulario para que el seguro cubra el medicamento que se considera necesario.   Si se requiere una autorizacin previa para que su compaa de seguros malta su medicamento, por favor permtanos de 1 a 2 das hbiles para completar este proceso.  Los precios de los medicamentos varan con frecuencia dependiendo del Environmental consultant de dnde se surte la receta y [place]  farmacias pueden ofrecer precios ms baratos.  El sitio web www.goodrx.com tiene cupones para medicamentos de Health and safety inspector. Los precios aqu no tienen en cuenta lo que podra costar con la ayuda del seguro (puede ser ms barato con su seguro), pero el sitio web puede darle el precio si no utiliz Tourist information centre manager.  - Puede imprimir el cupn correspondiente y llevarlo con su receta a la farmacia.  - Tambin puede pasar por nuestra oficina durante el horario de atencin regular y Education officer, museum una tarjeta de cupones de GoodRx.  - Si necesita que su receta se enve electrnicamente a una farmacia diferente, informe a nuestra oficina a travs de MyChart de [place] o por telfono llamando al [phone] y presione la opcin 4.

## 2024-01-23 NOTE — Progress Notes (Unsigned)
 Follow-Up Visit   Subjective  Kathleen Powers is a 73 y.o. female who presents for the following: Spot at right lateral corner of eye. Dur: couple of weeks. Itches at times. Has been using an eczema cream.   Check spot above left eye. Has been treated in the past. Has recurred.   The patient has spots, moles and lesions to be evaluated, some may be new or changing and the patient may have concern these could be cancer.  Hx of SCCs. Hx of dysplastic nevi.    The following portions of the chart were reviewed this encounter and updated as appropriate: medications, allergies, medical history  Review of Systems:  No other skin or systemic complaints except as noted in HPI or Assessment and Plan.  Objective  Well appearing patient in no apparent distress; mood and affect are within normal limits.  A focused examination was performed of the following areas: Face   Relevant physical exam findings are noted in the Assessment and Plan.  Right Lateral Canthus Eyelid Margin x1 Erythematous keratotic or waxy stuck-on papule or plaque. Left Upper Eyelid Erythema with mild scale       Assessment & Plan   HISTORY OF SQUAMOUS CELL CARCINOMA OF THE SKIN. Let upper calf. - No evidence of recurrence today - No lymphadenopathy - Recommend regular full body skin exams - Recommend daily broad spectrum sunscreen SPF 30+ to sun-exposed areas, reapply every 2 hours as needed.  - Call if any new or changing lesions are noted between office visits  HISTORY OF DYSPLASTIC NEVUS No evidence of recurrence today Recommend regular full body skin exams Recommend daily broad spectrum sunscreen SPF 30+ to sun-exposed areas, reapply every 2 hours as needed.  Call if any new or changing lesions are noted between office visits  ACTINIC DAMAGE - chronic, secondary to cumulative UV radiation exposure/sun exposure over time - diffuse scaly erythematous macules with underlying dyspigmentation -  Recommend daily broad spectrum sunscreen SPF 30+ to sun-exposed areas, reapply every 2 hours as needed.  - Recommend staying in the shade or wearing long sleeves, sun glasses (UVA+UVB protection) and wide brim hats (4-inch brim around the entire circumference of the hat). - Call for new or changing lesions.  INFLAMED SEBORRHEIC KERATOSIS Right Lateral [place]hus Eyelid Margin x1 Symptomatic, irritating, patient would like treated. Destruction of lesion - Right Lateral [place]hus Eyelid Margin x1 Complexity: simple   Destruction method: cryotherapy   Informed consent: discussed and consent obtained   Timeout:  patient name, date of birth, surgical site, and procedure verified Lesion destroyed using liquid nitrogen: Yes   Region frozen until ice ball extended beyond lesion: Yes   Outcome: patient tolerated procedure well with no complications   Post-procedure details: wound care instructions given   Additional details:  Prior to procedure, discussed risks of blister formation, small wound, skin dyspigmentation, or rare scar following cryotherapy. Recommend Vaseline ointment to treated areas while healing.   ECZEMATOUS DERMATITIS OF LEFT UPPER EYELID Left Upper Eyelid Start Pimecrolimus  cream twice a day as needed for rash on eyelids. If too expensive or not covered use OTC Hydrocortisone 0.1% cream twice a day as needed for rash/irritation.   If chronic or does not improve with Pimecrolimus  consider patch testing.  pimecrolimus  (ELIDEL ) 1 % cream - Left Upper Eyelid Apply twice daily as needed for irritation on eyelids   Return for TBSE As Scheduled, With Dr. Hester.  I, Jill Parcell, CMA, am acting as scribe for Alm Hester, MD.  Documentation: I have reviewed the above documentation for accuracy and completeness, and I agree with the above.  Alm Rhyme, MD

## 2024-01-24 ENCOUNTER — Encounter: Payer: Self-pay | Admitting: Dermatology

## 2024-04-09 ENCOUNTER — Other Ambulatory Visit: Payer: Self-pay | Admitting: Family Medicine

## 2024-04-09 DIAGNOSIS — Z1231 Encounter for screening mammogram for malignant neoplasm of breast: Secondary | ICD-10-CM

## 2024-05-07 ENCOUNTER — Ambulatory Visit: Admitting: Dermatology

## 2024-05-07 ENCOUNTER — Encounter: Payer: Self-pay | Admitting: Dermatology

## 2024-05-07 DIAGNOSIS — L578 Other skin changes due to chronic exposure to nonionizing radiation: Secondary | ICD-10-CM | POA: Diagnosis not present

## 2024-05-07 DIAGNOSIS — Z86018 Personal history of other benign neoplasm: Secondary | ICD-10-CM

## 2024-05-07 DIAGNOSIS — L814 Other melanin hyperpigmentation: Secondary | ICD-10-CM

## 2024-05-07 DIAGNOSIS — L219 Seborrheic dermatitis, unspecified: Secondary | ICD-10-CM

## 2024-05-07 DIAGNOSIS — D1801 Hemangioma of skin and subcutaneous tissue: Secondary | ICD-10-CM

## 2024-05-07 DIAGNOSIS — Z1283 Encounter for screening for malignant neoplasm of skin: Secondary | ICD-10-CM | POA: Diagnosis not present

## 2024-05-07 DIAGNOSIS — L821 Other seborrheic keratosis: Secondary | ICD-10-CM

## 2024-05-07 DIAGNOSIS — Z8589 Personal history of malignant neoplasm of other organs and systems: Secondary | ICD-10-CM

## 2024-05-07 DIAGNOSIS — Z85828 Personal history of other malignant neoplasm of skin: Secondary | ICD-10-CM

## 2024-05-07 DIAGNOSIS — D229 Melanocytic nevi, unspecified: Secondary | ICD-10-CM

## 2024-05-07 DIAGNOSIS — W908XXA Exposure to other nonionizing radiation, initial encounter: Secondary | ICD-10-CM

## 2024-05-07 DIAGNOSIS — L299 Pruritus, unspecified: Secondary | ICD-10-CM

## 2024-05-07 MED ORDER — MOMETASONE FUROATE 0.1 % EX SOLN: CUTANEOUS | 2 refills | Status: AC

## 2024-05-07 NOTE — Progress Notes (Unsigned)
 Follow-Up Visit   Subjective  Kathleen Powers is a 73 y.o. female who presents for the following: Skin Cancer Screening and Full Body Skin Exam hx of SCC, Dysplastic Nevi, itching in scalp  The patient presents for Total-Body Skin Exam (TBSE) for skin cancer screening and mole check. The patient has spots, moles and lesions to be evaluated, some may be new or changing and the patient may have concern these could be cancer.  The following portions of the chart were reviewed this encounter and updated as appropriate: medications, allergies, medical history  Review of Systems:  No other skin or systemic complaints except as noted in HPI or Assessment and Plan.  Objective  Well appearing patient in no apparent distress; mood and affect are within normal limits.  A full examination was performed including scalp, head, eyes, ears, nose, lips, neck, chest, axillae, abdomen, back, buttocks, bilateral upper extremities, bilateral lower extremities, hands, feet, fingers, toes, fingernails, and toenails. All findings within normal limits unless otherwise noted below.   Relevant physical exam findings are noted in the Assessment and Plan.    Assessment & Plan   SKIN CANCER SCREENING PERFORMED TODAY.  ACTINIC DAMAGE - Chronic condition, secondary to cumulative UV/sun exposure - diffuse scaly erythematous macules with underlying dyspigmentation - Recommend daily broad spectrum sunscreen SPF 30+ to sun-exposed areas, reapply every 2 hours as needed.  - Staying in the shade or wearing long sleeves, sun glasses (UVA+UVB protection) and wide brim hats (4-inch brim around the entire circumference of the hat) are also recommended for sun protection.  - Call for new or changing lesions.  LENTIGINES, SEBORRHEIC KERATOSES, HEMANGIOMAS - Benign normal skin lesions - Benign-appearing - Call for any changes  MELANOCYTIC NEVI - Tan-brown and/or pink-flesh-colored symmetric macules and papules -  Benign appearing on exam today - Observation - Call clinic for new or changing moles - Recommend daily use of broad spectrum spf 30+ sunscreen to sun-exposed areas.   HISTORY OF SQUAMOUS CELL CARCINOMA OF THE SKIN - No evidence of recurrence today - No lymphadenopathy - Recommend regular full body skin exams - Recommend daily broad spectrum sunscreen SPF 30+ to sun-exposed areas, reapply every 2 hours as needed.  - Call if any new or changing lesions are noted between office visits - L upper calf  HISTORY OF DYSPLASTIC NEVUS No evidence of recurrence today Recommend regular full body skin exams Recommend daily broad spectrum sunscreen SPF 30+ to sun-exposed areas, reapply every 2 hours as needed.  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  It tends to be exacerbated by stress and cooler weather.  People who have neurologic disease may experience new onset or exacerbation of existing seborrheic dermatitis.  The condition is not curable but treatable and can be controlled. Exam: scalp clear Treatment Plan: Start Mometasone sol 3-5d/wk to aa itchy scalp prn flares   Topical steroids (such as triamcinolone, fluocinolone, fluocinonide, mometasone, clobetasol, halobetasol, betamethasone, hydrocortisone) can cause thinning and lightening of the skin if they are used for too long in the same area. Your physician has selected the right strength medicine for your problem and area affected on the body. Please use your medication  only as directed by your physician to prevent side effects.    Return in about 1 year (around 05/07/2025) for TBSE, Hx of SCC, Hx of Dysplastic nevi.  I, Grayce Saunas, RMA, am acting as scribe for Alm Rhyme, MD .   Documentation: I have reviewed the above documentation for accuracy and completeness, and I agree with the above.  Alm Rhyme, MD

## 2024-05-07 NOTE — Patient Instructions (Signed)

## 2024-05-08 ENCOUNTER — Encounter: Payer: Self-pay | Admitting: Dermatology

## 2024-05-10 ENCOUNTER — Ambulatory Visit: Payer: Medicare PPO | Admitting: Dermatology

## 2024-05-23 ENCOUNTER — Ambulatory Visit
Admission: RE | Admit: 2024-05-23 | Discharge: 2024-05-23 | Disposition: A | Discharge status: Home / Self Care | Source: Ambulatory Visit | Attending: Family Medicine | Admitting: Family Medicine

## 2024-05-23 DIAGNOSIS — Z1231 Encounter for screening mammogram for malignant neoplasm of breast: Secondary | ICD-10-CM | POA: Insufficient documentation

## 2024-09-04 ENCOUNTER — Ambulatory Visit: Admit: 2024-09-04 | Admitting: Ophthalmology

## 2025-05-07 ENCOUNTER — Ambulatory Visit: Admitting: Dermatology
